# Patient Record
Sex: Male | Born: 1990 | Race: White | Hispanic: No | Marital: Single | Smoking: Former smoker
Health system: Southern US, Community
[De-identification: ages and names within clinical notes are randomized; demographics above are authoritative.]

## PROBLEM LIST (undated history)

## (undated) DIAGNOSIS — J939 Pneumothorax, unspecified: Secondary | ICD-10-CM

## (undated) DIAGNOSIS — F909 Attention-deficit hyperactivity disorder, unspecified type: Secondary | ICD-10-CM

## (undated) HISTORY — PX: HERNIA REPAIR: SHX51

---
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
## 2016-01-23 DIAGNOSIS — J939 Pneumothorax, unspecified: Secondary | ICD-10-CM

## 2016-01-23 HISTORY — PX: CHEST TUBE INSERTION: SHX231

## 2016-01-23 HISTORY — DX: Pneumothorax, unspecified: J93.9

## 2016-01-26 ENCOUNTER — Inpatient Hospital Stay (HOSPITAL_COMMUNITY)
Admission: AD | Admit: 2016-01-26 | Discharge: 2016-01-31 | DRG: 165 | Disposition: A | Discharge status: Home / Self Care | Payer: Self-pay | Source: Other Acute Inpatient Hospital | Attending: Cardiothoracic Surgery | Admitting: Cardiothoracic Surgery

## 2016-01-26 DIAGNOSIS — J9383 Other pneumothorax: Principal | ICD-10-CM | POA: Diagnosis present

## 2016-01-26 DIAGNOSIS — J9382 Other air leak: Secondary | ICD-10-CM | POA: Diagnosis present

## 2016-01-26 DIAGNOSIS — J939 Pneumothorax, unspecified: Secondary | ICD-10-CM | POA: Diagnosis present

## 2016-01-26 DIAGNOSIS — Z9689 Presence of other specified functional implants: Secondary | ICD-10-CM

## 2016-01-26 DIAGNOSIS — F17210 Nicotine dependence, cigarettes, uncomplicated: Secondary | ICD-10-CM | POA: Diagnosis present

## 2016-01-26 DIAGNOSIS — J439 Emphysema, unspecified: Secondary | ICD-10-CM

## 2016-01-26 HISTORY — DX: Pneumothorax, unspecified: J93.9

## 2016-01-26 HISTORY — DX: Attention-deficit hyperactivity disorder, unspecified type: F90.9

## 2016-01-26 MED ORDER — SODIUM CHLORIDE 0.9% FLUSH
3.0000 mL | Freq: Two times a day (BID) | INTRAVENOUS | Status: DC
  Administered 2016-01-26: 3 mL via INTRAVENOUS

## 2016-01-26 MED ORDER — SODIUM CHLORIDE 0.9 % IV SOLN: 250.0000 mL | INTRAVENOUS | Status: DC | PRN

## 2016-01-26 MED ORDER — OXYCODONE HCL 5 MG PO TABS
5.0000 mg | ORAL_TABLET | ORAL | Status: DC | PRN
  Administered 2016-01-26 – 2016-01-29 (×5): 5 mg via ORAL
  Filled 2016-01-26 (×5): qty 1

## 2016-01-26 MED ORDER — SODIUM CHLORIDE 0.9% FLUSH: 3.0000 mL | INTRAVENOUS | Status: DC | PRN

## 2016-01-26 MED ORDER — MORPHINE SULFATE (PF) 2 MG/ML IV SOLN
1.0000 mg | INTRAVENOUS | Status: DC | PRN
  Administered 2016-01-26: 1 mg via INTRAVENOUS
  Filled 2016-01-26: qty 1

## 2016-01-26 MED ORDER — ACETAMINOPHEN 325 MG PO TABS: 650.0000 mg | ORAL_TABLET | Freq: Four times a day (QID) | ORAL | Status: DC | PRN

## 2016-01-26 MED ORDER — ACETAMINOPHEN 650 MG RE SUPP: 650.0000 mg | Freq: Four times a day (QID) | RECTAL | Status: DC | PRN

## 2016-01-26 NOTE — H&P (Signed)
[facility] History and Physical  THOMOS DOMINE VWU:[id number] DOB: Jul 23, 1991 DOA: 01/26/2016  PCP: No primary care provider on file.   Chief Complaint: SOB  HPI: Edward Mcknight is a 25 y.o. male  Previously healthy who developed sob spontaneously 4 days ago. Since was found to have pneumothorax and was transitioned here from different hospital for recommendations by CT surgeon.  He reports no other new complaints. Pt has chest tube in place   Review of Systems:  Constitutional:  No weight loss, night sweats, Fevers, chills, fatigue.  HEENT:  No headaches, Difficulty swallowing,Tooth/dental problems,Sore throat,  No sneezing, itching, ear ache, nasal congestion, post nasal drip,  Cardio-vascular:  No chest pain, Orthopnea, PND, swelling in lower extremities, anasarca, dizziness, palpitations  GI:  No heartburn, indigestion, abdominal pain, nausea, vomiting, diarrhea, change in bowel habits, loss of appetite  Resp:  + shortness of breath with exertion or at rest. No excess mucus, no productive cough, No non-productive cough, No coughing up of blood.No change in color of mucus.No wheezing.No chest wall deformity  Skin:  no rash or lesions.  GU:  no dysuria, change in color of urine, no urgency or frequency. No flank pain.  Musculoskeletal:  No joint pain or swelling. No decreased range of motion. No back pain.  Psych:  No change in mood or affect. No depression or anxiety. No memory loss.   No past medical history on file. No past surgical history on file. Social History:  has no tobacco, alcohol, and drug history on file.  Allergies not on file  Family history - none reported  Prior to Admission medications   Not on File   Physical Exam: Filed Vitals:   01/26/16 1555  BP: 130/88  Pulse: 63  Temp: 98.4 F (36.9 C)  TempSrc: Oral  Resp: 18  Height:  (1.803 m)  Weight: 64.638 kg (142 lb 8 oz)  SpO2: 100%    Wt Readings from Last 3 Encounters:    01/26/16 64.638 kg (142 lb 8 oz)    General:  Appears calm and comfortable Eyes: PERRL, normal lids, irises & conjunctiva ENT: grossly normal hearing, lips & tongue Neck: no LAD, masses or thyromegaly Cardiovascular: RRR, no m/r/g. No LE edema. Respiratory: chest tube in place, no wheezes, speaking in full sentences Abdomen: soft, nt, nd Skin: no rash or induration seen on limited exam Musculoskeletal: grossly normal tone BUE/BLE Psychiatric: grossly normal mood and affect, speech fluent and appropriate Neurologic: grossly non-focal.          Labs on Admission:  Basic Metabolic Panel: No results for input(s): NA, K, CL, CO2, GLUCOSE, BUN, CREATININE, CALCIUM, MG, PHOS in the last 168 hours. Liver Function Tests: No results for input(s): AST, ALT, ALKPHOS, BILITOT, PROT, ALBUMIN in the last 168 hours. No results for input(s): LIPASE, AMYLASE in the last 168 hours. No results for input(s): AMMONIA in the last 168 hours. CBC: No results for input(s): WBC, NEUTROABS, HGB, HCT, MCV, PLT in the last 168 hours. Cardiac Enzymes: No results for input(s): CKTOTAL, CKMB, CKMBINDEX, TROPONINI in the last 168 hours.  BNP (last 3 results) No results for input(s): BNP in the last 8760 hours.  ProBNP (last 3 results) No results for input(s): PROBNP in the last 8760 hours.  CBG: No results for input(s): GLUCAP in the last 168 hours.  Radiological Exams on Admission: No results found.   Assessment/Plan Active Problems:   Pneumothorax - Consulted CT surgeon - continue supportive therapy  Code  Status: Full DVT Prophylaxis: SCD's Family Communication: None at bedside Disposition Plan: Pending surgeon's recommendations.  Time spent: > 35 minutes  Penny Pia [facility] Pager [phone]

## 2016-01-27 ENCOUNTER — Inpatient Hospital Stay (HOSPITAL_COMMUNITY): Payer: Self-pay

## 2016-01-27 DIAGNOSIS — J9311 Primary spontaneous pneumothorax: Secondary | ICD-10-CM

## 2016-01-27 DIAGNOSIS — F17200 Nicotine dependence, unspecified, uncomplicated: Secondary | ICD-10-CM

## 2016-01-27 LAB — BASIC METABOLIC PANEL
Anion gap: 12 (ref 5–15)
BUN: 12 mg/dL (ref 6–20)
CALCIUM: 9.7 mg/dL (ref 8.9–10.3)
CO2: 24 mmol/L (ref 22–32)
CREATININE: 0.87 mg/dL (ref 0.61–1.24)
Chloride: 103 mmol/L (ref 101–111)
GFR calc Af Amer: >60 mL/min (ref >60)
GLUCOSE: 101 mg/dL — AB (ref 65–99)
POTASSIUM: 4 mmol/L (ref 3.5–5.1)
Sodium: 139 mmol/L (ref 135–145)

## 2016-01-27 LAB — CBC
HCT: 44.6 % (ref 39.0–52.0)
HEMOGLOBIN: 15.2 g/dL (ref 13.0–17.0)
MCH: 32.1 pg (ref 26.0–34.0)
MCHC: 34.1 g/dL (ref 30.0–36.0)
MCV: 94.3 fL (ref 78.0–100.0)
PLATELETS: 203 10*3/uL (ref 150–400)
RBC: 4.73 MIL/uL (ref 4.22–5.81)
RDW: 12.5 % (ref 11.5–15.5)
WBC: 9.5 10*3/uL (ref 4.0–10.5)

## 2016-01-27 MED ORDER — CETYLPYRIDINIUM CHLORIDE 0.05 % MT LIQD
7.0000 mL | Freq: Two times a day (BID) | OROMUCOSAL | Status: DC
  Administered 2016-01-28 – 2016-01-30 (×3): 7 mL via OROMUCOSAL

## 2016-01-27 MED ORDER — KETOROLAC TROMETHAMINE 30 MG/ML IJ SOLN: 30.0000 mg | Freq: Four times a day (QID) | INTRAMUSCULAR | Status: DC | PRN

## 2016-01-27 NOTE — Progress Notes (Signed)
[facility] PROGRESS NOTE  Edward Mcknight ZOX:[id number] DOB: 05/15/1991 DOA: 01/26/2016 PCP: No primary care provider on file.  Assessment/Plan:  Active Problems:   Pneumothorax - CT surgeon consulted and on board and assisting. Currently managing chest tube - We will follow along and continue supportive therapy  Nicotine dependence - have offered nicotine patch today which patient refused   Code Status: full Family Communication: discussed with patient and family at bedside.  Disposition Plan: Pending improvement in condition   Consultants:  Cardiothoracic surgeon  Procedures:  none  Antibiotics:  None  HPI/Subjective: Pt has no new complaints. No acute issues overnight  Objective: Filed Vitals:   01/26/16 1929 01/27/16 0549  BP: 122/76 127/75  Pulse: 60 53  Temp: 97.9 F (36.6 C) 97.7 F (36.5 C)  Resp: 19 16    Intake/Output Summary (Last 24 hours) at 01/27/16 0935 Last data filed at 01/27/16 0700  Gross per 24 hour  Intake      0 ml  Output   1252 ml  Net  -1252 ml   Filed Weights   01/26/16 1555  Weight: 64.638 kg (142 lb 8 oz)    Exam:   General:  Pt in nad, alert and awake  Cardiovascular: rrr, no mrg  Respiratory: cta bl, no wheezes  Abdomen: soft, nt, nd  Musculoskeletal: no cyanosis or clubbing   Data Reviewed: Basic Metabolic Panel:  Recent Labs Lab 01/27/16 0214  NA 139  K 4.0  CL 103  CO2 24  GLUCOSE 101*  BUN 12  CREATININE 0.87  CALCIUM 9.7   Liver Function Tests: No results for input(s): AST, ALT, ALKPHOS, BILITOT, PROT, ALBUMIN in the last 168 hours. No results for input(s): LIPASE, AMYLASE in the last 168 hours. No results for input(s): AMMONIA in the last 168 hours. CBC:  Recent Labs Lab 01/27/16 0214  WBC 9.5  HGB 15.2  HCT 44.6  MCV 94.3  PLT 203   Cardiac Enzymes: No results for input(s): CKTOTAL, CKMB, CKMBINDEX, TROPONINI in the last 168 hours. BNP (last 3 results) No results for  input(s): BNP in the last 8760 hours.  ProBNP (last 3 results) No results for input(s): PROBNP in the last 8760 hours.  CBG: No results for input(s): GLUCAP in the last 168 hours.  No results found for this or any previous visit (from the past 240 hour(s)).   Studies: No results found.  Scheduled Meds: . antiseptic oral rinse  7 mL Mouth Rinse BID  . sodium chloride flush  3 mL Intravenous Q12H   Continuous Infusions:    Time spent: > 10 min   Penny Pia  [facility] Pager [phone]. If 7PM-7AM, please contact night-coverage at [email or web address], password [facility] 01/27/2016, 9:35 AM  LOS: 1 day

## 2016-01-27 NOTE — Consult Note (Signed)
[address]       [place] [address]             [phone]        KEIL PICKERING [facility] Medical Record #[id number] Date of Birth: 03/03/1991  Referring: Cena Benton Primary Care: No primary care provider on file.  Chief Complaint:   Pneumothorax  History of Present Illness:      Mr. Dube is a 25 yo white male with no previous history of Pneumothorax.  He states that approximately four days ago he was in his normal state of health.  However, he was moving around some large items at which time he developed sudden onset severe shortness of breath.  He presented to the local ED - [place] at which time he was found to have a left pneumothorax.  Chest tube was placed.  However, per the patient this was not sutured in place and during a coughing episode the chest tube fell out and had to be replaced.  The patient denies medical problems.  He states his father who is now deceased had 2 pneumothorax while he was alive.  He does smoke cigarettes and marijuana on a daily basis.  He is also using smokeless tobacco during his hospitalization to take the "edge" off from not being able to smoke.  Currently he continues to have pain at his chest tube site and some mild shortness of breath.  He also states he can feel air around moving around his chest tube insertion site. Patient was transferred at request of Hospitalitis at [facility].  Current Activity/ Functional Status: Patient is independent with mobility/ambulation, transfers, ADL's, IADL's.   Zubrod Score: At the time of surgery this patient's most appropriate activity status/level should be described as:     0    Normal activity, no symptoms     1    Restricted in physical strenuous activity but ambulatory, able to do out light work     2    Ambulatory and capable of self care, unable to do work activities, up and about                 more than 50%  Of the time                                3    Only limited self  care, in bed greater than 50% of waking hours     4    Completely disabled, no self care, confined to bed or chair     5    Moribund  Past medical history: No previous surgery No dm    History  Smoking status  . Smokes daily tobacco and marijuana  Smokeless tobacco  . Chew s dail;y    History  Alcohol Use: Not on file    Social History   Social History  . Marital Status: Single    Spouse Name: N/A  . Number of Children: N/A  . Years of Education: N/A   Occupational History  . Not on file.   Social History Main Topics  . Smoking status: Daily smoker   . Smokeless tobacco: Daily   . Alcohol Use: Yes   . Drug Use: Yes daily marijuana  . Sexual Activity: Not on file     Allergies  Allergen Reactions  . Cherry Nausea And Vomiting  . Wool Alcohol [Lanolin] Itching  Reaction to wool fabric    Current Facility-Administered Medications  Medication Dose Route Frequency Provider Last Rate Last Dose  . 0.9 %  sodium chloride infusion  250 mL Intravenous PRN Penny Pia, MD      . acetaminophen (TYLENOL) tablet 650 mg  650 mg Oral Q6H PRN Penny Pia, MD       Or  . acetaminophen (TYLENOL) suppository 650 mg  650 mg Rectal Q6H PRN Penny Pia, MD      . antiseptic oral rinse (CPC / CETYLPYRIDINIUM CHLORIDE 0.05%) solution 7 mL  7 mL Mouth Rinse BID Penny Pia, MD      . ketorolac (TORADOL) 30 MG/ML injection 30 mg  30 mg Intravenous Q6H PRN Erin R Barrett, PA-C      . morphine 2 MG/ML injection 1 mg  1 mg Intravenous Q3H PRN Penny Pia, MD   1 mg at 01/26/16 2057  . oxyCODONE (Oxy IR/ROXICODONE) immediate release tablet 5 mg  5 mg Oral Q4H PRN Penny Pia, MD   5 mg at 01/26/16 2230  . sodium chloride flush (NS) 0.9 % injection 3 mL  3 mL Intravenous Q12H Penny Pia, MD   3 mL at 01/26/16 1953  . sodium chloride flush (NS) 0.9 % injection 3 mL  3 mL Intravenous PRN Penny Pia, MD        Prescriptions prior to admission  Medication Sig Dispense Refill  Last Dose  . acetaminophen (TYLENOL) 500 MG tablet Take 2,500-3,000 mg by mouth daily as needed (migraine).   1-2  months ago  . Aspirin-Salicylamide-Caffeine (BC HEADACHE POWDER PO) Take 1 packet by mouth daily as needed (migraine).   2 months ago  . naproxen sodium (ALEVE) 220 MG tablet Take 660 mg by mouth daily as needed (pain/ migraines).   1-2  months ago    Family Hx: Father with copd and recurrent ptx, now deceased   Review of Systems:  Constitutional: negative Ears, nose, mouth, throat, and face: positive for poor dentition Respiratory: positive for cough, dyspnea on exertion and penumothorax Cardiovascular: negative Gastrointestinal: negative Neurological: negative     Cardiac Review of Systems: Y or N  Chest Pain [ n   ]  Resting SOB [  y ] Exertional SOB  Cove.Etienne  ]  Orthopnea [  ]   Pedal Edema [   ]    Palpitations [  ] Syncope  [  ]   Presyncope [   ]  General Review of Systems: [Y] = yes [  ]=no Constitional: recent weight change [  ]; anorexia [  ]; fatigue [  ]; nausea [  ]; night sweats [  ]; fever [  ]; or chills [  ]                                                               Dental: poor dentition[ y ]; Last Dentist visit:   Eye : blurred vision [  ]; diplopia [   ]; vision changes [  ];  Amaurosis fugax[  ]; Resp: cough [ y ];  wheezing[  ];  hemoptysis[  ]; shortness of breath[ y ]; paroxysmal nocturnal dyspnea[  ]; dyspnea on exertion[y  ]; or orthopnea[  ];  GI:  gallstones[  ], vomiting[  ];  dysphagia[  ]; melena[  ];  hematochezia [  ]; heartburn[  ];   Hx of  Colonoscopy[  ]; GU: kidney stones [  ]; hematuria[  ];   dysuria [  ];  nocturia[  ];  history of     obstruction [  ]; urinary frequency [  ]             Skin: rash, swelling[  ];, hair loss[  ];  peripheral edema[  ];  or itching[  ]; Musculosketetal: myalgias[  ];  joint swelling[  ];  joint erythema[  ];  joint pain[  ];  back pain[  ];  Heme/Lymph: bruising[  ];  bleeding[  ];  anemia[  ];  Neuro:  TIA[  ];  headaches[  ];  stroke[  ];  vertigo[  ];  seizures[  ];   paresthesias[  ];  difficulty walking[  ];  Psych:depression[  ]; anxiety[  ];  Endocrine: diabetes[  ];  thyroid dysfunction[  ];  Immunizations: Flu [  ]; Pneumococcal[  ];  Other:  Physical Exam: BP 127/75 mmHg  Pulse 53  Temp(Src) 97.7 F (36.5 C) (Oral)  Resp 16  Ht  (1.803 m)  Wt 142 lb 8 oz (64.638 kg)  BMI 19.88 kg/m2  SpO2 100%   General appearance: alert, cooperative and no distress Head: Normocephalic, without obvious abnormality, atraumatic Neck: no adenopathy, no carotid bruit, no JVD, supple, symmetrical, trachea midline and thyroid not enlarged, symmetric, no tenderness/mass/nodules Resp: clear to auscultation bilaterally Cardio: regular rate and rhythm GI: soft, non-tender; bowel sounds normal; no masses,  no organomegaly Extremities: extremities normal, atraumatic, no cyanosis or edema Neurologic: Grossly normal  Pleur vac changed, drainage system checked. On 20 suction, air leak with cough ,    Diagnostic Studies & Laboratory data:     Recent Radiology Findings:  No results found.   film done this am not read yet, no obvious ptx I have independently reviewed the above radiology studies  and reviewed the findings with the patient.  I have independently reviewed the above radiologic studies.  Recent Lab Findings: Lab Results  Component Value Date   WBC 9.5 01/27/2016   HGB 15.2 01/27/2016   HCT 44.6 01/27/2016   PLT 203 01/27/2016   GLUCOSE 101* 01/27/2016   NA 139 01/27/2016   K 4.0 01/27/2016   CL 103 01/27/2016   CREATININE 0.87 01/27/2016   BUN 12 01/27/2016   CO2 24 01/27/2016      Assessment / Plan:      1. Spontaneous pneumothorax- developed 4 days ago after heavy lifting, chest tube placed, fell out and replaced- currently chest tube has constant small intermittent air leak and 1-2+ with cough.... Tight occlusive dressing placed- leave chest tube to 20 cm  suction today 2. Pulm- CXR without pneumothorax, small amount of sub-q air present, continue IS 3. Pain control- continue Oxy, will add Toradol q 6 prn pain 4. Dispo- leave chest tube on suction today, repeat CXR in AM Will plan ct of chest no contrast tomorrow if air leak persists  I have discussed with patient poss left VATS Tuesday if we do not see improvement in chest tube /airleak over next 24 hours. Discussed with patient need to immediately stop smoking  Risks and options for treatment of PTX reviewed with the patient  I  spent 40 minutes counseling the patient face to face and 50% or more the  time was spent in counseling and  coordination of care. The total time spent in the appointment was 55 minutes.  Delight Ovens MD      [address] [place] [place] [address] Office [phone]   Beeper [phone]

## 2016-01-27 NOTE — Progress Notes (Signed)
Utilization review completed.  

## 2016-01-28 ENCOUNTER — Encounter (HOSPITAL_COMMUNITY): Payer: Self-pay | Admitting: General Practice

## 2016-01-28 ENCOUNTER — Inpatient Hospital Stay (HOSPITAL_COMMUNITY): Payer: Self-pay

## 2016-01-28 LAB — COMPREHENSIVE METABOLIC PANEL
ALT: 15 U/L — ABNORMAL LOW (ref 17–63)
AST: 15 U/L (ref 15–41)
Albumin: 4.2 g/dL (ref 3.5–5.0)
Alkaline Phosphatase: 61 U/L (ref 38–126)
Anion gap: 10 (ref 5–15)
BUN: 17 mg/dL (ref 6–20)
CO2: 26 mmol/L (ref 22–32)
Calcium: 9.6 mg/dL (ref 8.9–10.3)
Chloride: 98 mmol/L — ABNORMAL LOW (ref 101–111)
Creatinine, Ser: 1.07 mg/dL (ref 0.61–1.24)
GFR calc Af Amer: >60 mL/min (ref >60)
GFR calc non Af Amer: >60 mL/min (ref >60)
Glucose, Bld: 98 mg/dL (ref 65–99)
Potassium: 4.2 mmol/L (ref 3.5–5.1)
Sodium: 134 mmol/L — ABNORMAL LOW (ref 135–145)
Total Bilirubin: 0.6 mg/dL (ref 0.3–1.2)
Total Protein: 7.2 g/dL (ref 6.5–8.1)

## 2016-01-28 LAB — TYPE AND SCREEN
ABO/RH(D): A POS
Antibody Screen: NEGATIVE

## 2016-01-28 LAB — BASIC METABOLIC PANEL
ANION GAP: 8 (ref 5–15)
BUN: 15 mg/dL (ref 6–20)
CHLORIDE: 103 mmol/L (ref 101–111)
CO2: 29 mmol/L (ref 22–32)
CREATININE: 1.04 mg/dL (ref 0.61–1.24)
Calcium: 9.8 mg/dL (ref 8.9–10.3)
GFR calc non Af Amer: >60 mL/min (ref >60)
GLUCOSE: 110 mg/dL — AB (ref 65–99)
Potassium: 4.2 mmol/L (ref 3.5–5.1)
Sodium: 140 mmol/L (ref 135–145)

## 2016-01-28 LAB — URINALYSIS, ROUTINE W REFLEX MICROSCOPIC
Bilirubin Urine: NEGATIVE
Glucose, UA: NEGATIVE mg/dL
Hgb urine dipstick: NEGATIVE
Ketones, ur: NEGATIVE mg/dL
Leukocytes, UA: NEGATIVE
Nitrite: NEGATIVE
Protein, ur: NEGATIVE mg/dL
Specific Gravity, Urine: 1.017 (ref 1.005–1.030)
pH: 7.5 (ref 5.0–8.0)

## 2016-01-28 LAB — CBC
HCT: 45.2 % (ref 39.0–52.0)
Hemoglobin: 15.4 g/dL (ref 13.0–17.0)
MCH: 31.7 pg (ref 26.0–34.0)
MCHC: 34.1 g/dL (ref 30.0–36.0)
MCV: 93 fL (ref 78.0–100.0)
Platelets: 208 10*3/uL (ref 150–400)
RBC: 4.86 MIL/uL (ref 4.22–5.81)
RDW: 12.1 % (ref 11.5–15.5)
WBC: 10.2 10*3/uL (ref 4.0–10.5)

## 2016-01-28 LAB — BLOOD GAS, ARTERIAL
Acid-base deficit: 1.2 mmol/L (ref 0.0–2.0)
Bicarbonate: 23.1 mEq/L (ref 20.0–24.0)
Drawn by: 331761
FIO2: 0.21
O2 Saturation: 96.7 %
TCO2: 24.3 mmol/L (ref 0–100)
pCO2 arterial: 39.2 mmHg (ref 35.0–45.0)
pH, Arterial: 7.388 (ref 7.350–7.450)
pO2, Arterial: 88.6 mmHg (ref 80.0–100.0)

## 2016-01-28 LAB — APTT: aPTT: 29 seconds (ref 24–37)

## 2016-01-28 LAB — PROTIME-INR
INR: 0.99 (ref 0.00–1.49)
Prothrombin Time: 13.3 seconds (ref 11.6–15.2)

## 2016-01-28 LAB — ABO/RH: ABO/RH(D): A POS

## 2016-01-28 LAB — MRSA PCR SCREENING: MRSA by PCR: NEGATIVE

## 2016-01-28 MED ORDER — CEFUROXIME SODIUM 1.5 G IJ SOLR
1.5000 g | INTRAMUSCULAR | Status: DC
  Filled 2016-01-28: qty 1.5

## 2016-01-28 NOTE — Progress Notes (Addendum)
[address]       [place] [address]             [phone]      Subjective:  No new complaints.  Continues to have some pain at chest tube site.  Objective: Vital signs in last 24 hours: Temp:  [97.8 F (36.6 C)-98.3 F (36.8 C)] 98.3 F (36.8 C) (02/06 0551) Pulse Rate:  [63-72] 65 (02/06 0551) Cardiac Rhythm:  [-] Normal sinus rhythm (02/05 2256) Resp:  [18] 18 (02/06 0551) BP: (113-119)/(65-68) 113/65 mmHg (02/06 0551) SpO2:  [99 %-100 %] 99 % (02/06 0551)  Intake/Output from previous day: 02/05 0701 - 02/06 0700 In: 840 [P.O.:840] Out: 830 [Urine:800; Chest Tube:30]  General appearance: alert, cooperative and no distress Heart: regular rate and rhythm Lungs: clear to auscultation bilaterally Abdomen: soft, non-tender; bowel sounds normal; no masses,  no organomegaly Wound: clean and dry  Lab Results:  Recent Labs  01/27/16 0214  WBC 9.5  HGB 15.2  HCT 44.6  PLT 203   BMET:  Recent Labs  01/27/16 0214 01/28/16 0318  NA 139 140  K 4.0 4.2  CL 103 103  CO2 24 29  GLUCOSE 101* 110*  BUN 12 15  CREATININE 0.87 1.04  CALCIUM 9.7 9.8    PT/INR: No results for input(s): LABPROT, INR in the last 72 hours. ABG No results found for: PHART, HCO3, TCO2, ACIDBASEDEF, O2SAT CBG (last 3)  No results for input(s): GLUCAP in the last 72 hours.  Assessment/Plan:  1. Spontaneous Pneumothorax- chest tube in place on 20 cm of suction- today air leak is much improved about 1+- there is no drainage present 2. Pulm- CXR is stable, no evidence of pneumothorax, there continues to be evidence of sub q air around chest tube site 3. Dispo- patient stable, air leak improved, may be able to hold off on CT of chest since air leak is much improved, however will defer to Dr. Tyrone Sage, continue current care per primary   LOS: 2 days    Edward Mcknight, Edward Mcknight 01/28/2016  Still with 3 + air leak with cough, tube been in since 2/1 before transfer here Ct of  chest this am Likely vats tomorrow I have seen and examined Edward Mcknight and agree with the above assessment  and plan.  Delight Ovens MD Beeper [phone] Office [phone] 01/28/2016 8:04 AM  Ct Chest Wo Contrast  01/28/2016  CLINICAL DATA:  Follow-up pneumothorax status post chest tube insertion EXAM: CT CHEST WITHOUT CONTRAST TECHNIQUE: Multidetector CT imaging of the chest was performed following the standard protocol without IV contrast. COMPARISON:  Chest radiograph dated 02/07/2016 at 0640 hours FINDINGS: Mediastinum/Nodes: The heart is normal in size. No pericardial effusion. No suspicious mediastinal lymphadenopathy. Visualized thyroid is unremarkable. Lungs/Pleura: Left chest tube abuts the left heart border, tracking along the fissure. Residual small left pneumothorax. Mild patchy opacity in the left lower lobe, likely dependent atelectasis. Mild centrilobular/ paraseptal emphysematous changes in the bilateral lung apices. Right lung is otherwise clear. 4 mm left lower lobe pulmonary nodule (series 3/ image 39), likely benign given the patient's young age. No pleural effusion. Upper abdomen: Visualized upper abdomen is within normal limits. Musculoskeletal: Subcutaneous emphysema along the left lateral chest wall. No rib fracture is seen. Visualized osseous structures are within normal limits. IMPRESSION: Indwelling left chest tube, as above. Residual small left pneumothorax. Associated subcutaneous emphysema along the left lateral chest wall. Mild emphysematous changes in the  bilateral lung apices. Electronically Signed   By: Charline Bills M.D.   On: 01/28/2016 10:44   Dg Chest Port 1 View  01/28/2016  CLINICAL DATA:  Left-sided pneumothorax EXAM: PORTABLE CHEST 1 VIEW COMPARISON:  Portable chest x-ray of January 27, 2016 FINDINGS: The left-sided pneumothorax has slightly increased in volume. The pleural line lies between the third and fourth posterior rib interspaces. This amounts to  between 5 and 10% of the lung volume. The left-sided chest tube is unchanged with its tip overlying the medial aspect of the ninth rib posteriorly. The right lung is clear. There is no mediastinal shift. There is no pleural effusion. The heart and pulmonary vascularity are normal. The observed bony thorax is unremarkable. IMPRESSION: Slight interval increase in the pneumothorax volume amounting to approximately 5-10% of the pleural space volume. The left-sided chest tube is in stable position. Electronically Signed   By: David  [place] M.D.   On: 01/28/2016 07:58   Dg Chest Port 1 View  01/27/2016  CLINICAL DATA:  Chest tube EXAM: PORTABLE CHEST 1 VIEW COMPARISON:  01/26/2016 FINDINGS: Left chest tube remains in good position. Possible small left apical pneumothorax, not definite. Mild left lower lobe atelectasis. No effusion. Right lung is clear. IMPRESSION: Left chest tube remains in place. Possible small left apical pneumothorax. Left lower lobe atelectasis Electronically Signed   By: Marlan Palau M.D.   On: 01/27/2016 10:14   With persistent air leak have recommended to patient proceeding with left VATS and stapling of blebs. The goals risks and alternatives of the planned surgical procedure Bronchoscopy left VATS and stapling of blebs   have been discussed with the patient in detail. The risks of the procedure including death, infection, stroke, myocardial infarction, bleeding, blood transfusion have all been discussed specifically.  I have quoted Edward Mcknight a 1 % of perioperative mortality and a complication rate as high as 10 %. The patient's questions have been answered.Edward Mcknight is willing  to proceed with the planned procedure.  Delight Ovens MD      [address] [place] [place] [address] Office [phone]   Beeper [phone]

## 2016-01-28 NOTE — Progress Notes (Signed)
[facility] PROGRESS NOTE  Edward Mcknight:[id number] DOB: 22-Apr-1991 DOA: 01/26/2016 PCP: No primary care provider on file.  Assessment/Plan:  Active Problems:   Pneumothorax - CT surgeon consulted and on board and assisting. Currently managing chest tube - Continue supportive therapy  Nicotine dependence - have offered nicotine patch today which patient refused   Code Status: full Family Communication: discussed with patient and family at bedside.  Disposition Plan: Pending improvement in condition   Consultants:  Cardiothoracic surgeon  Procedures:  none  Antibiotics:  None  HPI/Subjective: Pt feels better he has concerns about surgical treatment  Objective: Filed Vitals:   01/27/16 2125 01/28/16 0551  BP: 115/68 113/65  Pulse: 63 65  Temp: 98.1 F (36.7 C) 98.3 F (36.8 C)  Resp: 18 18    Intake/Output Summary (Last 24 hours) at 01/28/16 1218 Last data filed at 01/28/16 0730  Gross per 24 hour  Intake    720 ml  Output    980 ml  Net   -260 ml   Filed Weights   01/26/16 1555  Weight: 64.638 kg (142 lb 8 oz)    Exam:   General:  Pt in nad, alert and awake  Cardiovascular: rrr, no mrg  Respiratory: cta bl, no wheezes  Abdomen: soft, nt, nd  Musculoskeletal: no cyanosis or clubbing   Data Reviewed: Basic Metabolic Panel:  Recent Labs Lab 01/27/16 0214 01/28/16 0318  NA 139 140  K 4.0 4.2  CL 103 103  CO2 24 29  GLUCOSE 101* 110*  BUN 12 15  CREATININE 0.87 1.04  CALCIUM 9.7 9.8   Liver Function Tests: No results for input(s): AST, ALT, ALKPHOS, BILITOT, PROT, ALBUMIN in the last 168 hours. No results for input(s): LIPASE, AMYLASE in the last 168 hours. No results for input(s): AMMONIA in the last 168 hours. CBC:  Recent Labs Lab 01/27/16 0214  WBC 9.5  HGB 15.2  HCT 44.6  MCV 94.3  PLT 203   Cardiac Enzymes: No results for input(s): CKTOTAL, CKMB, CKMBINDEX, TROPONINI in the last 168 hours. BNP (last 3  results) No results for input(s): BNP in the last 8760 hours.  ProBNP (last 3 results) No results for input(s): PROBNP in the last 8760 hours.  CBG: No results for input(s): GLUCAP in the last 168 hours.  No results found for this or any previous visit (from the past 240 hour(s)).   Studies: Ct Chest Wo Contrast  01/28/2016  CLINICAL DATA:  Follow-up pneumothorax status post chest tube insertion EXAM: CT CHEST WITHOUT CONTRAST TECHNIQUE: Multidetector CT imaging of the chest was performed following the standard protocol without IV contrast. COMPARISON:  Chest radiograph dated 02/07/2016 at 0640 hours FINDINGS: Mediastinum/Nodes: The heart is normal in size. No pericardial effusion. No suspicious mediastinal lymphadenopathy. Visualized thyroid is unremarkable. Lungs/Pleura: Left chest tube abuts the left heart border, tracking along the fissure. Residual small left pneumothorax. Mild patchy opacity in the left lower lobe, likely dependent atelectasis. Mild centrilobular/ paraseptal emphysematous changes in the bilateral lung apices. Right lung is otherwise clear. 4 mm left lower lobe pulmonary nodule (series 3/ image 39), likely benign given the patient's young age. No pleural effusion. Upper abdomen: Visualized upper abdomen is within normal limits. Musculoskeletal: Subcutaneous emphysema along the left lateral chest wall. No rib fracture is seen. Visualized osseous structures are within normal limits. IMPRESSION: Indwelling left chest tube, as above. Residual small left pneumothorax. Associated subcutaneous emphysema along the left lateral chest wall. Mild emphysematous changes in  the bilateral lung apices. Electronically Signed   By: Charline Bills M.D.   On: 01/28/2016 10:44   Dg Chest Port 1 View  01/28/2016  CLINICAL DATA:  Left-sided pneumothorax EXAM: PORTABLE CHEST 1 VIEW COMPARISON:  Portable chest x-ray of January 27, 2016 FINDINGS: The left-sided pneumothorax has slightly increased in  volume. The pleural line lies between the third and fourth posterior rib interspaces. This amounts to between 5 and 10% of the lung volume. The left-sided chest tube is unchanged with its tip overlying the medial aspect of the ninth rib posteriorly. The right lung is clear. There is no mediastinal shift. There is no pleural effusion. The heart and pulmonary vascularity are normal. The observed bony thorax is unremarkable. IMPRESSION: Slight interval increase in the pneumothorax volume amounting to approximately 5-10% of the pleural space volume. The left-sided chest tube is in stable position. Electronically Signed   By: David  [place] M.D.   On: 01/28/2016 07:58   Dg Chest Port 1 View  01/27/2016  CLINICAL DATA:  Chest tube EXAM: PORTABLE CHEST 1 VIEW COMPARISON:  01/26/2016 FINDINGS: Left chest tube remains in good position. Possible small left apical pneumothorax, not definite. Mild left lower lobe atelectasis. No effusion. Right lung is clear. IMPRESSION: Left chest tube remains in place. Possible small left apical pneumothorax. Left lower lobe atelectasis Electronically Signed   By: Marlan Palau M.D.   On: 01/27/2016 10:14    Scheduled Meds: . antiseptic oral rinse  7 mL Mouth Rinse BID  . sodium chloride flush  3 mL Intravenous Q12H   Continuous Infusions:    Time spent: > 10 min   Penny Pia  [facility] Pager [phone]. If 7PM-7AM, please contact night-coverage at [email or web address], password [facility] 01/28/2016, 12:18 PM  LOS: 2 days

## 2016-01-29 ENCOUNTER — Encounter (HOSPITAL_COMMUNITY)
Admission: AD | Disposition: A | Discharge status: Home / Self Care | Payer: Self-pay | Source: Other Acute Inpatient Hospital | Attending: Family Medicine

## 2016-01-29 ENCOUNTER — Inpatient Hospital Stay (HOSPITAL_COMMUNITY): Payer: Self-pay | Admitting: Anesthesiology

## 2016-01-29 ENCOUNTER — Inpatient Hospital Stay (HOSPITAL_COMMUNITY): Payer: Self-pay

## 2016-01-29 ENCOUNTER — Inpatient Hospital Stay (HOSPITAL_COMMUNITY): Payer: MEDICAID | Admitting: Anesthesiology

## 2016-01-29 ENCOUNTER — Inpatient Hospital Stay (HOSPITAL_COMMUNITY): Payer: MEDICAID

## 2016-01-29 ENCOUNTER — Encounter (HOSPITAL_COMMUNITY): Payer: Self-pay | Admitting: Certified Registered Nurse Anesthetist

## 2016-01-29 DIAGNOSIS — J939 Pneumothorax, unspecified: Secondary | ICD-10-CM | POA: Diagnosis present

## 2016-01-29 HISTORY — PX: VIDEO ASSISTED THORACOSCOPY: SHX5073

## 2016-01-29 HISTORY — PX: VIDEO BRONCHOSCOPY: SHX5072

## 2016-01-29 LAB — POCT I-STAT 7, (LYTES, BLD GAS, ICA,H+H)
Acid-base deficit: 4 mmol/L — ABNORMAL HIGH (ref 0.0–2.0)
Bicarbonate: 28.9 mEq/L — ABNORMAL HIGH (ref 20.0–24.0)
Calcium, Ion: 1.34 mmol/L — ABNORMAL HIGH (ref 1.12–1.23)
HEMATOCRIT: 46 % (ref 39.0–52.0)
Hemoglobin: 15.6 g/dL (ref 13.0–17.0)
O2 SAT: 100 %
PCO2 ART: 91.5 mmHg — AB (ref 35.0–45.0)
PO2 ART: 254 mmHg — AB (ref 80.0–100.0)
POTASSIUM: 4.5 mmol/L (ref 3.5–5.1)
Sodium: 140 mmol/L (ref 135–145)
TCO2: 32 mmol/L (ref 0–100)
pH, Arterial: 7.107 — CL (ref 7.350–7.450)

## 2016-01-29 LAB — GLUCOSE, CAPILLARY: GLUCOSE-CAPILLARY: 124 mg/dL — AB (ref 65–99)

## 2016-01-29 SURGERY — BRONCHOSCOPY, VIDEO-ASSISTED
Anesthesia: General | Site: Chest

## 2016-01-29 MED ORDER — ROCURONIUM BROMIDE 50 MG/5ML IV SOLN
INTRAVENOUS | Status: AC
  Filled 2016-01-29: qty 1

## 2016-01-29 MED ORDER — HYDROMORPHONE 1 MG/ML IV SOLN
INTRAVENOUS | Status: AC
  Filled 2016-01-29: qty 25

## 2016-01-29 MED ORDER — ACETAMINOPHEN 500 MG PO TABS
1000.0000 mg | ORAL_TABLET | Freq: Four times a day (QID) | ORAL | Status: DC
  Administered 2016-01-29 – 2016-01-31 (×6): 1000 mg via ORAL
  Filled 2016-01-29 (×7): qty 2

## 2016-01-29 MED ORDER — ROCURONIUM BROMIDE 100 MG/10ML IV SOLN
INTRAVENOUS | Status: DC | PRN
  Administered 2016-01-29: 50 mg via INTRAVENOUS
  Administered 2016-01-29: 10 mg via INTRAVENOUS

## 2016-01-29 MED ORDER — SUCCINYLCHOLINE CHLORIDE 20 MG/ML IJ SOLN
INTRAMUSCULAR | Status: AC
  Filled 2016-01-29: qty 1

## 2016-01-29 MED ORDER — FENTANYL 40 MCG/ML IV SOLN
INTRAVENOUS | Status: AC
  Filled 2016-01-29: qty 25

## 2016-01-29 MED ORDER — PROPOFOL 10 MG/ML IV BOLUS
INTRAVENOUS | Status: DC | PRN
  Administered 2016-01-29: 200 mg via INTRAVENOUS

## 2016-01-29 MED ORDER — ONDANSETRON HCL 4 MG/2ML IJ SOLN
4.0000 mg | Freq: Four times a day (QID) | INTRAMUSCULAR | Status: DC | PRN
  Filled 2016-01-29: qty 2

## 2016-01-29 MED ORDER — SUGAMMADEX SODIUM 200 MG/2ML IV SOLN
INTRAVENOUS | Status: AC
  Filled 2016-01-29: qty 2

## 2016-01-29 MED ORDER — MIDAZOLAM HCL 2 MG/2ML IJ SOLN
INTRAMUSCULAR | Status: AC
  Administered 2016-01-29: 1 mg via INTRAVENOUS
  Filled 2016-01-29: qty 2

## 2016-01-29 MED ORDER — NALOXONE HCL 0.4 MG/ML IJ SOLN
0.4000 mg | INTRAMUSCULAR | Status: DC | PRN
  Filled 2016-01-29: qty 1

## 2016-01-29 MED ORDER — FENTANYL 40 MCG/ML IV SOLN
INTRAVENOUS | Status: DC
  Administered 2016-01-29: 15:00:00 via INTRAVENOUS
  Administered 2016-01-29: 170 ug via INTRAVENOUS

## 2016-01-29 MED ORDER — FENTANYL CITRATE (PF) 100 MCG/2ML IJ SOLN
INTRAMUSCULAR | Status: DC | PRN
Start: 2016-01-29 — End: 2016-01-29
  Administered 2016-01-29: 50 ug via INTRAVENOUS
  Administered 2016-01-29: 100 ug via INTRAVENOUS
  Administered 2016-01-29 (×3): 50 ug via INTRAVENOUS

## 2016-01-29 MED ORDER — DEXTROSE 5 % IV SOLN
1.5000 g | Freq: Two times a day (BID) | INTRAVENOUS | Status: AC
  Administered 2016-01-29 – 2016-01-30 (×2): 1.5 g via INTRAVENOUS
  Filled 2016-01-29 (×2): qty 1.5

## 2016-01-29 MED ORDER — LACTATED RINGERS IV SOLN
INTRAVENOUS | Status: DC
  Administered 2016-01-29 (×3): via INTRAVENOUS

## 2016-01-29 MED ORDER — LIDOCAINE HCL (CARDIAC) 20 MG/ML IV SOLN
INTRAVENOUS | Status: AC
  Filled 2016-01-29: qty 5

## 2016-01-29 MED ORDER — PHENYLEPHRINE 40 MCG/ML (10ML) SYRINGE FOR IV PUSH (FOR BLOOD PRESSURE SUPPORT)
PREFILLED_SYRINGE | INTRAVENOUS | Status: AC
  Filled 2016-01-29: qty 10

## 2016-01-29 MED ORDER — DEXAMETHASONE SODIUM PHOSPHATE 10 MG/ML IJ SOLN
INTRAMUSCULAR | Status: DC | PRN
  Administered 2016-01-29: 10 mg via INTRAVENOUS

## 2016-01-29 MED ORDER — BISACODYL 5 MG PO TBEC
10.0000 mg | DELAYED_RELEASE_TABLET | Freq: Every day | ORAL | Status: DC
  Administered 2016-01-31: 10 mg via ORAL
  Filled 2016-01-29 (×3): qty 2

## 2016-01-29 MED ORDER — EPHEDRINE SULFATE 50 MG/ML IJ SOLN
INTRAMUSCULAR | Status: AC
  Filled 2016-01-29: qty 1

## 2016-01-29 MED ORDER — MIDAZOLAM HCL 2 MG/2ML IJ SOLN
INTRAMUSCULAR | Status: AC
  Filled 2016-01-29: qty 2

## 2016-01-29 MED ORDER — MIDAZOLAM HCL 5 MG/5ML IJ SOLN
INTRAMUSCULAR | Status: DC | PRN
  Administered 2016-01-29: 2 mg via INTRAVENOUS

## 2016-01-29 MED ORDER — HYDROMORPHONE HCL 1 MG/ML IJ SOLN
0.2500 mg | INTRAMUSCULAR | Status: DC | PRN
  Administered 2016-01-29: 0.5 mg via INTRAVENOUS
  Administered 2016-01-29: 1 mg via INTRAVENOUS

## 2016-01-29 MED ORDER — DIPHENHYDRAMINE HCL 50 MG/ML IJ SOLN
12.5000 mg | Freq: Four times a day (QID) | INTRAMUSCULAR | Status: DC | PRN
  Filled 2016-01-29: qty 0.25

## 2016-01-29 MED ORDER — SUGAMMADEX SODIUM 200 MG/2ML IV SOLN
INTRAVENOUS | Status: DC | PRN
  Administered 2016-01-29: 200 mg via INTRAVENOUS

## 2016-01-29 MED ORDER — PROPOFOL 10 MG/ML IV BOLUS
INTRAVENOUS | Status: AC
  Filled 2016-01-29: qty 20

## 2016-01-29 MED ORDER — HYDROMORPHONE HCL 1 MG/ML IJ SOLN
INTRAMUSCULAR | Status: AC
  Filled 2016-01-29: qty 1

## 2016-01-29 MED ORDER — DIPHENHYDRAMINE HCL 12.5 MG/5ML PO ELIX
12.5000 mg | ORAL_SOLUTION | Freq: Four times a day (QID) | ORAL | Status: DC | PRN
  Filled 2016-01-29: qty 5

## 2016-01-29 MED ORDER — PANTOPRAZOLE SODIUM 40 MG PO TBEC
40.0000 mg | DELAYED_RELEASE_TABLET | Freq: Every day | ORAL | Status: DC
  Administered 2016-01-29 – 2016-01-31 (×3): 40 mg via ORAL
  Filled 2016-01-29 (×3): qty 1

## 2016-01-29 MED ORDER — FENTANYL CITRATE (PF) 250 MCG/5ML IJ SOLN
INTRAMUSCULAR | Status: AC
  Filled 2016-01-29: qty 5

## 2016-01-29 MED ORDER — OXYCODONE HCL 5 MG PO TABS
5.0000 mg | ORAL_TABLET | ORAL | Status: DC | PRN
  Administered 2016-01-29: 5 mg via ORAL
  Administered 2016-01-30 – 2016-01-31 (×5): 10 mg via ORAL
  Filled 2016-01-29 (×2): qty 2
  Filled 2016-01-29: qty 1
  Filled 2016-01-29 (×3): qty 2

## 2016-01-29 MED ORDER — 0.9 % SODIUM CHLORIDE (POUR BTL) OPTIME
TOPICAL | Status: DC | PRN
  Administered 2016-01-29: 1000 mL

## 2016-01-29 MED ORDER — KCL IN DEXTROSE-NACL 20-5-0.9 MEQ/L-%-% IV SOLN
INTRAVENOUS | Status: DC
  Administered 2016-01-29: 23:00:00 via INTRAVENOUS
  Filled 2016-01-29: qty 1000

## 2016-01-29 MED ORDER — PANTOPRAZOLE SODIUM 40 MG PO TBEC: 40.0000 mg | DELAYED_RELEASE_TABLET | Freq: Every day | ORAL | Status: DC

## 2016-01-29 MED ORDER — TRAMADOL HCL 50 MG PO TABS
50.0000 mg | ORAL_TABLET | Freq: Four times a day (QID) | ORAL | Status: DC | PRN
  Administered 2016-01-31: 100 mg via ORAL
  Filled 2016-01-29: qty 2

## 2016-01-29 MED ORDER — SENNOSIDES-DOCUSATE SODIUM 8.6-50 MG PO TABS
1.0000 | ORAL_TABLET | Freq: Every day | ORAL | Status: DC
  Administered 2016-01-30: 1 via ORAL
  Filled 2016-01-29 (×2): qty 1

## 2016-01-29 MED ORDER — ONDANSETRON HCL 4 MG/2ML IJ SOLN
INTRAMUSCULAR | Status: AC
  Filled 2016-01-29: qty 2

## 2016-01-29 MED ORDER — SODIUM CHLORIDE 0.9% FLUSH: 9.0000 mL | INTRAVENOUS | Status: DC | PRN

## 2016-01-29 MED ORDER — DEXTROSE 5 % IV SOLN
1.5000 g | INTRAVENOUS | Status: DC | PRN
  Administered 2016-01-29: 1.5 g via INTRAVENOUS

## 2016-01-29 MED ORDER — LIDOCAINE HCL (CARDIAC) 20 MG/ML IV SOLN
INTRAVENOUS | Status: DC | PRN
  Administered 2016-01-29: 50 mg via INTRAVENOUS

## 2016-01-29 MED ORDER — ONDANSETRON HCL 4 MG/2ML IJ SOLN
INTRAMUSCULAR | Status: DC | PRN
  Administered 2016-01-29: 4 mg via INTRAVENOUS

## 2016-01-29 MED ORDER — FENTANYL CITRATE (PF) 100 MCG/2ML IJ SOLN
INTRAMUSCULAR | Status: AC
  Administered 2016-01-29: 50 ug via INTRAVENOUS
  Filled 2016-01-29: qty 2

## 2016-01-29 MED ORDER — POTASSIUM CHLORIDE 10 MEQ/50ML IV SOLN
10.0000 meq | Freq: Every day | INTRAVENOUS | Status: DC | PRN
  Filled 2016-01-29: qty 50

## 2016-01-29 MED ORDER — INSULIN ASPART 100 UNIT/ML ~~LOC~~ SOLN: 0.0000 [IU] | Freq: Four times a day (QID) | SUBCUTANEOUS | Status: DC

## 2016-01-29 MED ORDER — PHENYLEPHRINE HCL 10 MG/ML IJ SOLN
10.0000 mg | INTRAVENOUS | Status: DC | PRN
  Administered 2016-01-29: 50 ug/min via INTRAVENOUS

## 2016-01-29 MED ORDER — ACETAMINOPHEN 160 MG/5ML PO SOLN: 1000.0000 mg | Freq: Four times a day (QID) | ORAL | Status: DC

## 2016-01-29 MED ORDER — ONDANSETRON HCL 4 MG/2ML IJ SOLN
4.0000 mg | Freq: Four times a day (QID) | INTRAMUSCULAR | Status: DC | PRN
  Administered 2016-01-29 – 2016-01-30 (×2): 4 mg via INTRAVENOUS
  Filled 2016-01-29 (×4): qty 2

## 2016-01-29 SURGICAL SUPPLY — 77 items
APPLICATOR TIP COSEAL (VASCULAR PRODUCTS) IMPLANT
APPLICATOR TIP EXT COSEAL (VASCULAR PRODUCTS) IMPLANT
BLADE SURG 11 STRL SS (BLADE) IMPLANT
BRUSH CYTOL CELLEBRITY 1.5X140 (MISCELLANEOUS) IMPLANT
CANISTER SUCTION 2500CC (MISCELLANEOUS) ×3 IMPLANT
CATH KIT ON Q 5IN SLV (PAIN MANAGEMENT) IMPLANT
CATH THORACIC 28FR (CATHETERS) ×3 IMPLANT
CATH THORACIC 36FR (CATHETERS) IMPLANT
CATH THORACIC 36FR RT ANG (CATHETERS) IMPLANT
CLEANER TIP ELECTROSURG 2X2 (MISCELLANEOUS) ×6 IMPLANT
CLIP TI MEDIUM 6 (CLIP) IMPLANT
CONT SPEC 4OZ CLIKSEAL STRL BL (MISCELLANEOUS) ×9 IMPLANT
COVER TABLE BACK 60X90 (DRAPES) ×3 IMPLANT
DERMABOND ADHESIVE PROPEN (GAUZE/BANDAGES/DRESSINGS) ×1
DERMABOND ADVANCED (GAUZE/BANDAGES/DRESSINGS) ×1
DERMABOND ADVANCED .7 DNX12 (GAUZE/BANDAGES/DRESSINGS) ×2 IMPLANT
DERMABOND ADVANCED .7 DNX6 (GAUZE/BANDAGES/DRESSINGS) ×2 IMPLANT
DRAPE LAPAROSCOPIC ABDOMINAL (DRAPES) ×3 IMPLANT
DRAPE WARM FLUID 44X44 (DRAPE) ×3 IMPLANT
DRILL BIT 7/64X5 (BIT) IMPLANT
ELECT BLADE 4.0 EZ CLEAN MEGAD (MISCELLANEOUS) ×3
ELECT REM PT RETURN 9FT ADLT (ELECTROSURGICAL) ×3
ELECTRODE BLDE 4.0 EZ CLN MEGD (MISCELLANEOUS) ×2 IMPLANT
ELECTRODE REM PT RTRN 9FT ADLT (ELECTROSURGICAL) ×2 IMPLANT
FORCEPS BIOP RJ4 1.8 (CUTTING FORCEPS) IMPLANT
GAUZE SPONGE 4X4 12PLY STRL (GAUZE/BANDAGES/DRESSINGS) ×3 IMPLANT
GLOVE BIO SURGEON STRL SZ 6.5 (GLOVE) ×6 IMPLANT
GOWN STRL REUS W/ TWL LRG LVL3 (GOWN DISPOSABLE) ×6 IMPLANT
GOWN STRL REUS W/TWL LRG LVL3 (GOWN DISPOSABLE) ×3
KIT BASIN OR (CUSTOM PROCEDURE TRAY) ×3 IMPLANT
KIT CLEAN ENDO COMPLIANCE (KITS) ×3 IMPLANT
KIT ROOM TURNOVER OR (KITS) ×3 IMPLANT
KIT SUCTION CATH 14FR (SUCTIONS) ×3 IMPLANT
MARKER SKIN DUAL TIP RULER LAB (MISCELLANEOUS) ×3 IMPLANT
NEEDLE BIOPSY TRANSBRONCH 21G (NEEDLE) IMPLANT
NS IRRIG 1000ML POUR BTL (IV SOLUTION) ×6 IMPLANT
OIL SILICONE PENTAX (PARTS (SERVICE/REPAIRS)) ×3 IMPLANT
PACK CHEST (CUSTOM PROCEDURE TRAY) ×3 IMPLANT
PAD ARMBOARD 7.5X6 YLW CONV (MISCELLANEOUS) ×6 IMPLANT
PASSER SUT SWANSON 36MM LOOP (INSTRUMENTS) IMPLANT
RELOAD STAPLER GOLD 60MM (STAPLE) ×4 IMPLANT
RUBBERBAND STERILE (MISCELLANEOUS) ×3 IMPLANT
SEALANT PROGEL (MISCELLANEOUS) IMPLANT
SEALANT SURG COSEAL 4ML (VASCULAR PRODUCTS) IMPLANT
SEALANT SURG COSEAL 8ML (VASCULAR PRODUCTS) IMPLANT
SOLUTION ANTI FOG 6CC (MISCELLANEOUS) ×3 IMPLANT
STAPLE ECHEON FLEX 60 POW ENDO (STAPLE) ×3 IMPLANT
STAPLER RELOAD GOLD 60MM (STAPLE) ×6
SUT ETHILON 3 0 FSL (SUTURE) ×3 IMPLANT
SUT PROLENE 3 0 SH DA (SUTURE) IMPLANT
SUT PROLENE 4 0 RB 1 (SUTURE)
SUT PROLENE 4-0 RB1 .5 CRCL 36 (SUTURE) IMPLANT
SUT SILK  1 MH (SUTURE) ×5
SUT SILK 1 MH (SUTURE) ×10 IMPLANT
SUT SILK 2 0SH CR/8 30 (SUTURE) IMPLANT
SUT SILK 3 0SH CR/8 30 (SUTURE) IMPLANT
SUT VIC AB 1 CTX 18 (SUTURE) IMPLANT
SUT VIC AB 1 CTX 36 (SUTURE)
SUT VIC AB 1 CTX36XBRD ANBCTR (SUTURE) IMPLANT
SUT VIC AB 2-0 CTX 36 (SUTURE) IMPLANT
SUT VIC AB 2-0 UR6 27 (SUTURE) ×6 IMPLANT
SUT VIC AB 3-0 X1 27 (SUTURE) ×3 IMPLANT
SUT VICRYL 2 TP 1 (SUTURE) IMPLANT
SWAB COLLECTION DEVICE MRSA (MISCELLANEOUS) IMPLANT
SYR 20ML ECCENTRIC (SYRINGE) ×3 IMPLANT
SYSTEM SAHARA CHEST DRAIN ATS (WOUND CARE) ×3 IMPLANT
TAPE CLOTH 4X10 WHT NS (GAUZE/BANDAGES/DRESSINGS) ×3 IMPLANT
TAPE CLOTH SURG 4X10 WHT LF (GAUZE/BANDAGES/DRESSINGS) ×3 IMPLANT
TIP APPLICATOR SPRAY EXTEND 16 (VASCULAR PRODUCTS) IMPLANT
TOWEL OR 17X24 6PK STRL BLUE (TOWEL DISPOSABLE) ×3 IMPLANT
TOWEL OR 17X26 10 PK STRL BLUE (TOWEL DISPOSABLE) ×6 IMPLANT
TRAP SPECIMEN MUCOUS 40CC (MISCELLANEOUS) ×3 IMPLANT
TRAY FOLEY CATH SILVER 16FR LF (SET/KITS/TRAYS/PACK) ×3 IMPLANT
TROCAR XCEL BLUNT TIP 100MML (ENDOMECHANICALS) IMPLANT
TUBE ANAEROBIC SPECIMEN COL (MISCELLANEOUS) IMPLANT
TUBE CONNECTING 20X1/4 (TUBING) ×6 IMPLANT
WATER STERILE IRR 1000ML POUR (IV SOLUTION) ×6 IMPLANT

## 2016-01-29 NOTE — Progress Notes (Signed)
Cardiothoracic surgeon to take over care. Would like to thank Dr. Tyrone Sage for his assistance with this case.  Edward Mcknight, [employer]

## 2016-01-29 NOTE — Anesthesia Procedure Notes (Addendum)
Procedure Name: Intubation Date/Time: 01/29/2016 11:46 AM Performed by: Little Ishikawa L Pre-anesthesia Checklist: Patient identified, Emergency Drugs available, Suction available, Patient being monitored and Timeout performed Patient Re-evaluated:Patient Re-evaluated prior to inductionOxygen Delivery Method: Circle system utilized Preoxygenation: Pre-oxygenation with 100% oxygen Intubation Type: IV induction Ventilation: Mask ventilation without difficulty Laryngoscope Size: Mac and 4 Grade View: Grade I Tube type: Oral Endobronchial tube: Double lumen EBT, EBT position confirmed by auscultation and EBT position confirmed by fiberoptic bronchoscope and 39 Fr Number of attempts: 1 Placement Confirmation: ETT inserted through vocal cords under direct vision,  positive ETCO2 and breath sounds checked- equal and bilateral Tube secured with: Tape Dental Injury: Teeth and Oropharynx as per pre-operative assessment  Comments: For video bronchoscopy, a 8.5 OETT inserted prior to DLT without difficulty. BBS, +etco2, bilateral chest rise noted. Replaced with DLT    Procedure Name: LMA Insertion Date/Time: 01/29/2016 1:44 PM Performed by: Little Ishikawa L Pre-anesthesia Checklist: Patient identified, Emergency Drugs available, Suction available, Patient being monitored and Timeout performed Patient Re-evaluated:Patient Re-evaluated prior to inductionOxygen Delivery Method: Circle system utilized Preoxygenation: Pre-oxygenation with 100% oxygen Ventilation: Two handed mask ventilation required LMA: LMA inserted LMA Size: 4.0 Number of attempts: 1 Placement Confirmation: positive ETCO2 and breath sounds checked- equal and bilateral Dental Injury: Teeth and Oropharynx as per pre-operative assessment  Comments: LMA inserted after extubation of OETT by Dr. Sampson Goon due to inadequate respirations and ventilation.

## 2016-01-29 NOTE — Op Note (Signed)
[name], Edward Mcknight NO.:  [id number]  MEDICAL RECORD NO.:  [id number]  LOCATION:  3S16C                        FACILITY:  [facility]  PHYSICIAN:  Sheliah Plane, MD    DATE OF BIRTH:  December 06, 1991  DATE OF PROCEDURE:  01/29/2016 DATE OF DISCHARGE:                              OPERATIVE REPORT   PREOPERATIVE DIAGNOSIS:  Spontaneous left pneumothorax with continued air leak.  POSTOPERATIVE DIAGNOSIS:  Spontaneous left pneumothorax with continued air leak.  SURGICAL PROCEDURE:  Bronchoscopy, left video-assisted thoracoscopy, stapling of apical blebs, and mechanical pleurodesis.  SURGEON:  Sheliah Plane, M.D.  FIRST ASSISTANT:  Doree Fudge, PA  BRIEF HISTORY:  The patient is a 25 year old male, who on January 23, 2016, while living in [place], [place] developed shortness of breath and was seen in [facility] and found to have a pneumothorax. Local treating physician there placed a left chest tube.  By the patient's history, this chest tube fell out the following day, and it was replaced back through the same site.  The patient was watched in the hospital until Saturday, January 26, 2016, when he was transferred to [facility] because of continued air leak.  A CT scan was performed that showed the chest tube was in reasonable position.  Though the patient continued to have air leak, a left video-assisted thoracoscopy was recommend to the patient who agreed and signed informed consent.  DESCRIPTION OF PROCEDURE:  With A line and IVs in place, the patient underwent general endotracheal anesthesia without incident.  Through the double-lumen endotracheal tube and after appropriate time-out was performed, a  fiberoptic bronchoscopy was performed confirming good position of the double-lumen tube and without evidence of endobronchial lesions.  Scope was then removed.  The patient was turned in lateral decubitus position with left side up.  Left  chest was prepped with Betadine and draped in sterile manner.  A second time-out was confirmed and we proceeded with left video-assisted thoracoscopy.  The previously placed chest tube had been removed.  A new port site approximately at the sixth intercostal space midaxillary line was developed and through this, a 30-degree videoscope was introduced. Careful examination of the chest revealed no definite tube injury to the lung from the previous placement.  There was area of obvious bleb at the apex of the lung.  Two additional port sites were then created, one anterior and one posterior and through these 2 port sites, we then proceeded to localize the obvious apical bleb and stapled across it with 2 firings of stapler. The specimen was removed and submitted to Pathology.  The head was put in down position and sterile saline was instilled in the chest and the lung gently re-inflated without evidence of air leak.  The fluid was removed.  We then performed a mechanical pleurodesis.  A single 28 chest tube was left through the initial port site.  The 2 remaining port sites were then closed with 2-0 Vicryl in the deep muscle and a 3-0 subcuticular stitch.  There was no air leak at the completion of the procedure, and the lung re-inflated nicely.  The incision from the initial chest tube was then closed with interrupted  nylon sutures.  The patient tolerated the procedure with minimal blood loss, was extubated in the operating room and transferred to the recovery room for further postoperative care.     Sheliah Plane, MD     EG/MEDQ  D:  01/29/2016  T:  01/29/2016  Job:  [id number]

## 2016-01-29 NOTE — Anesthesia Preprocedure Evaluation (Addendum)
Anesthesia Evaluation  Patient identified by MRN, date of birth, ID band Patient awake    Reviewed: Allergy & Precautions, H&P , NPO status , Patient's Chart, lab work & pertinent test results  Airway Mallampati: II  TM Distance: >3 FB Neck ROM: Full    Dental no notable dental hx. (+) Teeth Intact, Dental Advisory Given   Pulmonary neg pulmonary ROS, former smoker,    Pulmonary exam normal breath sounds clear to auscultation       Cardiovascular negative cardio ROS   Rhythm:Regular Rate:Normal     Neuro/Psych negative neurological ROS  negative psych ROS   GI/Hepatic negative GI ROS, Neg liver ROS,   Endo/Other  negative endocrine ROS  Renal/GU negative Renal ROS  negative genitourinary   Musculoskeletal   Abdominal   Peds  (+) ADHD Hematology negative hematology ROS (+)   Anesthesia Other Findings   Reproductive/Obstetrics negative OB ROS                            Anesthesia Physical Anesthesia Plan  ASA: II  Anesthesia Plan: General   Post-op Pain Management:    Induction: Intravenous  Airway Management Planned: Double Lumen EBT  Additional Equipment: Arterial line  Intra-op Plan:   Post-operative Plan: Extubation in OR  Informed Consent: I have reviewed the patients History and Physical, chart, labs and discussed the procedure including the risks, benefits and alternatives for the proposed anesthesia with the patient or authorized representative who has indicated his/her understanding and acceptance.   Dental advisory given  Plan Discussed with: CRNA  Anesthesia Plan Comments:         Anesthesia Quick Evaluation

## 2016-01-29 NOTE — Progress Notes (Signed)
Report to M. Brande RN as primary caregiver 

## 2016-01-29 NOTE — Brief Op Note (Addendum)
      [address]       [name] [address]             [phone]    01/29/2016  1:26 PM  PATIENT:  Edward Mcknight  25 y.o. male  PRE-OPERATIVE DIAGNOSIS:  LEFT SPONTANEOUS PTX  POST-OPERATIVE DIAGNOSIS:  LEFT SPONTANEOUS PTX  PROCEDURE:  VIDEO BRONCHOSCOPY, LEFT VIDEO ASSISTED THORACOSCOPY, STAPELING of LEFT APICAL BLEB, MECHANICAL PLEURODESIS  SURGEON:  Surgeon(s) and Role:    * Delight Ovens, MD - Primary  PHYSICIAN ASSISTANT: Doree Fudge PA-C  ANESTHESIA:   general  EBL:  Total I/O In: 1000 [I.V.:1000] Out: 600 [Urine:600]  BLOOD ADMINISTERED:none  DRAINS: 2 28 French chest tubes placed in the left pleural space   SPECIMEN:  Source of Specimen:  Left apical bleb  DISPOSITION OF SPECIMEN:  PATHOLOGY  COUNTS CORRECT:  YES  DICTATION: .Dragon Dictation  PLAN OF CARE: Admit to inpatient   PATIENT DISPOSITION:  PACU - hemodynamically stable.   Delay start of Pharmacological VTE agent (>24hrs) due to surgical blood loss or risk of bleeding: yes

## 2016-01-29 NOTE — Transfer of Care (Signed)
Immediate Anesthesia Transfer of Care Note  Patient: Edward Mcknight  Procedure(s) Performed: Procedure(s): VIDEO BRONCHOSCOPY (N/A) VIDEO ASSISTED THORACOSCOPY (Left)  Patient Location: PACU  Anesthesia Type:General  Level of Consciousness: awake  Airway & Oxygen Therapy: Patient Spontanous Breathing and Patient connected to nasal cannula oxygen  Post-op Assessment: Report given to RN and Post -op Vital signs reviewed and stable  Post vital signs: stable  Last Vitals:  Filed Vitals:   01/29/16 0327 01/29/16 1418  BP: 120/82   Pulse: 63 94  Temp: 36.4 C 37.2 C  Resp: 16     Complications: No apparent anesthesia complications

## 2016-01-29 NOTE — Care Management Note (Signed)
Case Management Note Donn Pierini RN, BSN Unit 2W-Case Manager [phone]  Patient Details  Name: Edward Mcknight MRN: [id number] Date of Birth: 05/17/1991  Subjective/Objective:    Pt admitted with spont. pntx- chest tube placed, plan for VATS tomorrow               Action/Plan: PTA pt lived at home- independent- anticipate return home post op - CM to follow post op for progression and needs  Expected Discharge Date:                  Expected Discharge Plan:  Home/Self Care  In-House Referral:     Discharge planning Services  CM Consult  Post Acute Care Choice:    Choice offered to:     DME Arranged:    DME Agency:     HH Arranged:    HH Agency:     Status of Service:  In process, will continue to follow  Medicare Important Message Given:    Date Medicare IM Given:    Medicare IM give by:    Date Additional Medicare IM Given:    Additional Medicare Important Message give by:     If discussed at Long Length of Stay Meetings, dates discussed:    Additional Comments:  Darrold Span, RN 01/29/2016, 9:47 AM

## 2016-01-29 NOTE — Anesthesia Postprocedure Evaluation (Signed)
Anesthesia Post Note  Patient: Edward Mcknight  Procedure(s) Performed: Procedure(s) (LRB): VIDEO BRONCHOSCOPY (N/A) VIDEO ASSISTED THORACOSCOPY (Left)  Patient location during evaluation: PACU Anesthesia Type: General Level of consciousness: awake and alert Pain management: pain level controlled Vital Signs Assessment: post-procedure vital signs reviewed and stable Respiratory status: spontaneous breathing, nonlabored ventilation, respiratory function stable and patient connected to nasal cannula oxygen Cardiovascular status: blood pressure returned to baseline and stable Postop Assessment: no signs of nausea or vomiting Anesthetic complications: no    Last Vitals:  Filed Vitals:   01/29/16 1548 01/29/16 1552  BP: 131/66   Pulse:  112  Temp:    Resp:  21    Last Pain:  Filed Vitals:   01/29/16 1553  PainSc: 4                  Makel Mcmann,W. EDMOND

## 2016-01-29 NOTE — Progress Notes (Signed)
Care of pt assumed by MA Okie Jansson RN from M. Brande RN 

## 2016-01-30 ENCOUNTER — Encounter (HOSPITAL_COMMUNITY): Payer: Self-pay | Admitting: Cardiothoracic Surgery

## 2016-01-30 ENCOUNTER — Inpatient Hospital Stay (HOSPITAL_COMMUNITY): Payer: Self-pay

## 2016-01-30 LAB — GLUCOSE, CAPILLARY
Glucose-Capillary: 151 mg/dL — ABNORMAL HIGH (ref 65–99)
Glucose-Capillary: 100 mg/dL — ABNORMAL HIGH (ref 65–99)

## 2016-01-30 LAB — BASIC METABOLIC PANEL
ANION GAP: 10 (ref 5–15)
BUN: 10 mg/dL (ref 6–20)
CHLORIDE: 102 mmol/L (ref 101–111)
CO2: 25 mmol/L (ref 22–32)
Calcium: 9.1 mg/dL (ref 8.9–10.3)
Creatinine, Ser: 0.8 mg/dL (ref 0.61–1.24)
GFR calc Af Amer: >60 mL/min (ref >60)
GLUCOSE: 132 mg/dL — AB (ref 65–99)
POTASSIUM: 4.1 mmol/L (ref 3.5–5.1)
Sodium: 137 mmol/L (ref 135–145)

## 2016-01-30 LAB — BLOOD GAS, ARTERIAL
Acid-Base Excess: 0.2 mmol/L (ref 0.0–2.0)
BICARBONATE: 24.3 meq/L — AB (ref 20.0–24.0)
O2 Content: 5 L/min
O2 SAT: 99.2 %
PATIENT TEMPERATURE: 98.6
PCO2 ART: 39 mmHg (ref 35.0–45.0)
PH ART: 7.41 (ref 7.350–7.450)
PO2 ART: 167 mmHg — AB (ref 80.0–100.0)
TCO2: 25.5 mmol/L (ref 0–100)

## 2016-01-30 LAB — CBC
HEMATOCRIT: 37.6 % — AB (ref 39.0–52.0)
HEMOGLOBIN: 12.7 g/dL — AB (ref 13.0–17.0)
MCH: 31.1 pg (ref 26.0–34.0)
MCHC: 33.8 g/dL (ref 30.0–36.0)
MCV: 92.2 fL (ref 78.0–100.0)
Platelets: 163 10*3/uL (ref 150–400)
RBC: 4.08 MIL/uL — ABNORMAL LOW (ref 4.22–5.81)
RDW: 12 % (ref 11.5–15.5)
WBC: 14.3 10*3/uL — ABNORMAL HIGH (ref 4.0–10.5)

## 2016-01-30 NOTE — Care Management Note (Signed)
Case Management Note  Patient Details  Name: Edward Mcknight MRN: [id number] Date of Birth: 1991/10/03  Subjective/Objective:    Date: 01/30/16 Spoke with patient at the bedside.  Introduced self as Sports coach and explained role in discharge planning and how to be reached.  Verified patient lives in [place] with some friends and family, pta indep. Expressed potential need for no other DME.  Verified patient anticipates to go home with family and friends, at time of discharge and will have  part-time supervision by family friends neighbors at this time to best of their knowledge.  Patient confirmed needing help with their medication.  Patient drives  Or is driven by family and friends to MD appointments.  Verified patient has no  PCP- will set up apt with [facility] tomorrow for patient. Plan: CM will continue to follow for discharge planning and [facility] resources.                 Action/Plan:   Expected Discharge Date:                  Expected Discharge Plan:  Home/Self Care  In-House Referral:     Discharge planning Services  CM Consult  Post Acute Care Choice:    Choice offered to:     DME Arranged:    DME Agency:     HH Arranged:    HH Agency:     Status of Service:  In process, will continue to follow  Medicare Important Message Given:    Date Medicare IM Given:    Medicare IM give by:    Date Additional Medicare IM Given:    Additional Medicare Important Message give by:     If discussed at Long Length of Stay Meetings, dates discussed:    Additional Comments:  Leone Haven, RN 01/30/2016, 5:13 PM

## 2016-01-30 NOTE — Progress Notes (Signed)
Offered to walk with patient.  He stated he will "wait until my mom gets here, then I will".  Pt up ad lib in room performing self care activities, including walking in room to complete tasks.

## 2016-01-30 NOTE — Discharge Instructions (Signed)
Thoracoscopy, Care After °Refer to this sheet in the next few weeks. These instructions provide you with information about caring for yourself after your procedure. Your health care provider may also give you more specific instructions. Your treatment has been planned according to current medical practices, but problems sometimes occur. Call your health care provider if you have any problems or questions after your procedure. °WHAT TO EXPECT AFTER THE PROCEDURE: °After your procedure, it is common to feel sore for up to two weeks. °HOME CARE INSTRUCTIONS °· There are many different ways to close and cover an incision, including stitches (sutures), skin glue, and adhesive strips. Follow your health care provider's instructions about: °¨ Incision care. °¨ Bandage (dressing) changes and removal. °¨ Incision closure removal. °· Check your incision area every day for signs of infection. Watch for: °¨ Redness, swelling, or pain. °¨ Fluid, blood, or pus. °· Take medicines only as directed by your health care provider. °· Try to cough often. Coughing helps to protect against lung infection (pneumonia). It may hurt to cough. If this happens, hold a pillow against your chest when you cough. °· Take deep breaths. This also helps to protect against pneumonia. °· If you were given an incentive spirometer, use it as directed by your health care provider. °· Do not take baths, swim, or use a hot tub until your health care provider approves. You may take showers. °· Avoid lifting until your health care provider approves. °· Avoid driving until your health care provider approves. °· Do not travel by airplane after the chest tube is removed until your health care provider approves. °SEEK MEDICAL CARE IF: °· You have a fever. °· Pain medicines do not ease your pain. °· You have redness, swelling, or increasing pain in your incision area. °· You develop a cough that does not go away, or you are coughing up mucus that is yellow or  green. °SEEK IMMEDIATE MEDICAL CARE IF: °· You have fluid, blood, or pus coming from your incision. °· There is a bad smell coming from your incision or dressing. °· You develop a rash. °· You have difficulty breathing. °· You cough up blood. °· You develop light-headedness or you feel faint. °· You develop chest pain. °· Your heartbeat feels irregular or very fast. °  °This information is not intended to replace advice given to you by your health care provider. Make sure you discuss any questions you have with your health care provider. °  °Document Released: 06/27/2005 Document Revised: 12/29/2014 Document Reviewed: 08/23/2014 °Elsevier Interactive Patient Education ©2016 [employer] ° °

## 2016-01-30 NOTE — Discharge Summary (Signed)
Physician Discharge Summary       [address]       [name] [address]             [phone]    Patient ID: Edward Mcknight MRN: [id number] DOB/AGE: 17-Aug-1991 25 y.o.  Admit date: 01/26/2016 Discharge date: 01/31/2016  Admission Diagnosis: Left spontaneous pneumothorax with air leak  Active Diagnoses:  Tobacco abuse   Procedure (s):  Bronchoscopy, left video-assisted thoracoscopy, stapling of apical blebs, and mechanical pleurodesis by Dr. Tyrone Sage.  Pathology:   History of Presenting Illness: Previously healthy who developed sob spontaneously 4 days ago. Since was found to have pneumothorax and was transitioned here from different hospital for recommendations by CT surgeon. He reports no other new complaints. Pt has chest tube in place  Brief Hospital Course:  A line and foley were removed on post op day one. There was no air leak from the chest tube. Daily chest x rays were obtained and remained stable. Chest tube was placed to water seal 2/8 and then removed 2/9. He is ambulating on room air. He is tolerating a diet. He is felt surgically stable for today.   Latest Vital Signs: Blood pressure 121/78, pulse 87, temperature 98.2 F (36.8 C), temperature source Oral, resp. rate 26, height  (1.803 m), weight 150 lb 9.2 oz (68.3 kg), SpO2 99 %.  Physical Exam: General appearance: alert, cooperative and no distress Heart: regular rate and rhythm Lungs: clear to auscultation bilaterally Abdomen: soft, non-tender; bowel sounds normal; no masses, no organomegaly Wound: clean and dry  Discharge Condition: Stable and discharged to home  Recent laboratory studies:  Lab Results  Component Value Date   WBC 9.3 01/31/2016   HGB 12.4* 01/31/2016   HCT 37.6* 01/31/2016   MCV 92.8 01/31/2016   PLT 180 01/31/2016   Lab Results  Component Value Date   NA 140 01/31/2016   K 3.6 01/31/2016   CL 102 01/31/2016   CO2 28 01/31/2016   CREATININE 0.79  01/31/2016   GLUCOSE 112* 01/31/2016    Diagnostic Studies: Ct Chest Wo Contrast  01/28/2016  CLINICAL DATA:  Follow-up pneumothorax status post chest tube insertion EXAM: CT CHEST WITHOUT CONTRAST TECHNIQUE: Multidetector CT imaging of the chest was performed following the standard protocol without IV contrast. COMPARISON:  Chest radiograph dated 02/07/2016 at 0640 hours FINDINGS: Mediastinum/Nodes: The heart is normal in size. No pericardial effusion. No suspicious mediastinal lymphadenopathy. Visualized thyroid is unremarkable. Lungs/Pleura: Left chest tube abuts the left heart border, tracking along the fissure. Residual small left pneumothorax. Mild patchy opacity in the left lower lobe, likely dependent atelectasis. Mild centrilobular/ paraseptal emphysematous changes in the bilateral lung apices. Right lung is otherwise clear. 4 mm left lower lobe pulmonary nodule (series 3/ image 39), likely benign given the patient's young age. No pleural effusion. Upper abdomen: Visualized upper abdomen is within normal limits. Musculoskeletal: Subcutaneous emphysema along the left lateral chest wall. No rib fracture is seen. Visualized osseous structures are within normal limits. IMPRESSION: Indwelling left chest tube, as above. Residual small left pneumothorax. Associated subcutaneous emphysema along the left lateral chest wall. Mild emphysematous changes in the bilateral lung apices. Electronically Signed   By: Charline Bills M.D.   On: 01/28/2016 10:44   Dg Chest Port 1 View  01/30/2016  CLINICAL DATA:  LEFT pneumothorax. EXAM: PORTABLE CHEST 1 VIEW COMPARISON:  01/29/2016 FINDINGS: LEFT chest tube in place with with small LEFT apical pneumothorax which is not appreciably  changed in volume. The pleura edge is difficult define. Estimated volume pneumothorax is 5-10%. Slight improvement in central venous congestion. IMPRESSION: 1. Persistent LEFT apical pneumothorax with chest tube in place. 2. Improvement  central venous congestion. Electronically Signed   By: Genevive Bi M.D.   On: 01/30/2016 07:54   Discharge Medications:   Medication List    STOP taking these medications        acetaminophen 500 MG tablet  Commonly known as:  TYLENOL      TAKE these medications        ALEVE 220 MG tablet  Generic drug:  naproxen sodium  Take 660 mg by mouth daily as needed (pain/ migraines).     BC HEADACHE POWDER PO  Take 1 packet by mouth daily as needed (migraine).     oxyCODONE 5 MG immediate release tablet  Commonly known as:  Oxy IR/ROXICODONE  Take 1-2 tablets (5-10 mg total) by mouth every 6 (six) hours as needed for severe pain.        Follow Up Appointments: Follow-up Information    Follow up with Delight Ovens, MD On 02/14/2016.   Specialty:  Cardiothoracic Surgery   Why:  PA/LAT CXR to be taken (at [facility] which is in the same building as Dr. Dennie Maizes office) on 02/14/2016 at 1:45 pm ;Appointment time is at 2:30 pm   Contact information:   [address] [employer] [address] [place] [address] [phone]       Follow up with [facility] On 02/06/2016.   Why:  3 pm for hospital follow up   Contact information:   [address] [place] [address] [phone]      Follow up with [facility] [place].   Specialty:  Cardiothoracic Surgery   Why:  office will contact you about suture removal appointment   Contact information:   [address] [place], [address] [place] [address] [phone]      Signed: Rogers Blocker 01/31/2016, 3:29 PM

## 2016-01-30 NOTE — Progress Notes (Addendum)
      [address]       [name] [address]             [phone]      1 Day Post-Op Procedure(s) (LRB): VIDEO BRONCHOSCOPY (N/A) VIDEO ASSISTED THORACOSCOPY (Left)   Subjective:  Mr. Edward Mcknight states he feels good.  He did have some episodes of nausea after using PCA.  He asks if we can discontinue this.  He would also like his foley catheter removed.  Objective: Vital signs in last 24 hours: Temp:  [97.9 F (36.6 C)-99 F (37.2 C)] 97.9 F (36.6 C) (02/08 0746) Pulse Rate:  [62-112] 62 (02/08 0410) Cardiac Rhythm:  [-] Normal sinus rhythm (02/08 0410) Resp:  [15-37] 18 (02/08 0410) BP: (107-138)/(66-81) 112/68 mmHg (02/08 0410) SpO2:  [90 %-100 %] 100 % (02/08 0410) Arterial Line BP: (103-174)/(57-100) 119/62 mmHg (02/07 2230) Weight:  [150 lb 9.2 oz (68.3 kg)] 150 lb 9.2 oz (68.3 kg) (02/07 2230)  Intake/Output from previous day: 02/07 0701 - 02/08 0700 In: 2705 [P.O.:240; I.V.:2465] Out: 2770 [Urine:2225; Emesis/NG output:250; Blood:5; Chest Tube:290] Intake/Output this shift: Total I/O In: -  Out: 1000 [Urine:1000]  General appearance: alert, cooperative and no distress Heart: regular rate and rhythm Lungs: clear to auscultation bilaterally Abdomen: soft, non-tender; bowel sounds normal; no masses,  no organomegaly Wound: clean and dry  Lab Results:  Recent Labs  01/28/16 1612 01/29/16 1405 01/30/16 0331  WBC 10.2  --  14.3*  HGB 15.4 15.6 12.7*  HCT 45.2 46.0 37.6*  PLT 208  --  163   BMET:  Recent Labs  01/28/16 1612 01/29/16 1405 01/30/16 0331  NA 134* 140 137  K 4.2 4.5 4.1  CL 98*  --  102  CO2 26  --  25  GLUCOSE 98  --  132*  BUN 17  --  10  CREATININE 1.07  --  0.80  CALCIUM 9.6  --  9.1    PT/INR:  Recent Labs  01/28/16 1612  LABPROT 13.3  INR 0.99   ABG    Component Value Date/Time   PHART 7.410 01/30/2016 0345   HCO3 24.3* 01/30/2016 0345   TCO2 25.5 01/30/2016 0345   ACIDBASEDEF 4.0* 01/29/2016 1405     O2SAT 99.2 01/30/2016 0345   CBG (last 3)   Recent Labs  01/29/16 2340 01/30/16 0502  GLUCAP 124* 151*    Assessment/Plan: S/P Procedure(s) (LRB): VIDEO BRONCHOSCOPY (N/A) VIDEO ASSISTED THORACOSCOPY (Left)  1. Chest tube- no air leak, 332 cc output since surgery- leave on suction today, possibly place to water seal at midnight will discuss with Dr. Tyrone Sage 2. Pulm- no acute issues, not on oxygen, CXR shows 5% apical pneumothorax 3. D/C Arterial Line 4. D/C PCA, pain pills have been adequately controlling pain, and fentanyl was making him sick 5. D/C IV Fluids 6. D/C Foley Catheter 7. Dispo- patient stable, possibly place chest tube to water seal at midnight, repeat CXR in AM will discuss with staff   LOS: 4 days    Edward Mcknight 01/30/2016  I have seen and examined Edward Mcknight and agree with the above assessment  and plan.  Delight Ovens MD Beeper [phone] Office [phone] 01/30/2016 6:03 PM

## 2016-01-30 NOTE — Progress Notes (Signed)
20 ml's of fentanyl wasted,from PCA syringe. Osvaldo Human as second RN to verify.

## 2016-01-31 ENCOUNTER — Inpatient Hospital Stay (HOSPITAL_COMMUNITY): Payer: MEDICAID

## 2016-01-31 ENCOUNTER — Inpatient Hospital Stay (HOSPITAL_COMMUNITY): Payer: Self-pay

## 2016-01-31 LAB — CBC
HEMATOCRIT: 37.6 % — AB (ref 39.0–52.0)
Hemoglobin: 12.4 g/dL — ABNORMAL LOW (ref 13.0–17.0)
MCH: 30.6 pg (ref 26.0–34.0)
MCHC: 33 g/dL (ref 30.0–36.0)
MCV: 92.8 fL (ref 78.0–100.0)
Platelets: 180 10*3/uL (ref 150–400)
RBC: 4.05 MIL/uL — AB (ref 4.22–5.81)
RDW: 12.1 % (ref 11.5–15.5)
WBC: 9.3 10*3/uL (ref 4.0–10.5)

## 2016-01-31 LAB — COMPREHENSIVE METABOLIC PANEL
ALBUMIN: 3.2 g/dL — AB (ref 3.5–5.0)
ALT: 11 U/L — ABNORMAL LOW (ref 17–63)
ANION GAP: 10 (ref 5–15)
AST: 15 U/L (ref 15–41)
Alkaline Phosphatase: 48 U/L (ref 38–126)
BUN: 8 mg/dL (ref 6–20)
CHLORIDE: 102 mmol/L (ref 101–111)
CO2: 28 mmol/L (ref 22–32)
Calcium: 9.2 mg/dL (ref 8.9–10.3)
Creatinine, Ser: 0.79 mg/dL (ref 0.61–1.24)
GFR calc Af Amer: >60 mL/min (ref >60)
GFR calc non Af Amer: >60 mL/min (ref >60)
GLUCOSE: 112 mg/dL — AB (ref 65–99)
POTASSIUM: 3.6 mmol/L (ref 3.5–5.1)
SODIUM: 140 mmol/L (ref 135–145)
Total Bilirubin: 0.8 mg/dL (ref 0.3–1.2)
Total Protein: 5.8 g/dL — ABNORMAL LOW (ref 6.5–8.1)

## 2016-01-31 MED ORDER — OXYCODONE HCL 5 MG PO TABS: 5.0000 mg | ORAL_TABLET | Freq: Four times a day (QID) | ORAL | Status: DC | PRN

## 2016-01-31 NOTE — Progress Notes (Signed)
Pt has walked 30 laps around unit since coming to floor.

## 2016-01-31 NOTE — Care Management Note (Signed)
Case Management Note  Patient Details  Name: Edward Mcknight MRN: [id number] Date of Birth: 19-Apr-1991  Subjective/Objective:   Patient for dc home today, NCM went over with patient regarding apt at the [facility] about follow up and gave him the brochure, informed patient if he needed to cancel and change the apt time to call [facility].                  Action/Plan:   Expected Discharge Date:                  Expected Discharge Plan:  Home/Self Care  In-House Referral:     Discharge planning Services  CM Consult  Post Acute Care Choice:    Choice offered to:     DME Arranged:    DME Agency:     HH Arranged:    HH Agency:     Status of Service:  Completed, signed off  Medicare Important Message Given:    Date Medicare IM Given:    Medicare IM give by:    Date Additional Medicare IM Given:    Additional Medicare Important Message give by:     If discussed at Long Length of Stay Meetings, dates discussed:    Additional Comments:  Leone Haven, RN 01/31/2016, 5:33 PM

## 2016-01-31 NOTE — Progress Notes (Signed)
Pt received d/c instructions inc f/up visits, Rx, etc. Pt indicated understanding.

## 2016-01-31 NOTE — Progress Notes (Addendum)
[address]       [name] [address]             [phone]      2 Days Post-Op Procedure(s) (LRB): VIDEO BRONCHOSCOPY (N/A) VIDEO ASSISTED THORACOSCOPY (Left) Subjective: Feels well  Objective: Vital signs in last 24 hours: Temp:  [97.6 F (36.4 C)-98.6 F (37 C)] 98.3 F (36.8 C) (02/09 0732) Pulse Rate:  [68-94] 68 (02/09 0346) Cardiac Rhythm:  [-] Normal sinus rhythm (02/08 2123) Resp:  [15-25] 22 (02/09 0346) BP: (107-135)/(70-88) 113/73 mmHg (02/09 0346) SpO2:  [92 %-100 %] 92 % (02/09 0346)  Hemodynamic parameters for last 24 hours:    Intake/Output from previous day: 02/08 0701 - 02/09 0700 In: 240 [P.O.:240] Out: 2500 [Urine:2500] Intake/Output this shift:    General appearance: alert, cooperative and no distress Heart: regular rate and rhythm Lungs: minor ronchi, improves with cough Abdomen: benign Wound: incis ok  Lab Results:  Recent Labs  01/30/16 0331 01/31/16 0500  WBC 14.3* 9.3  HGB 12.7* 12.4*  HCT 37.6* 37.6*  PLT 163 180   BMET:  Recent Labs  01/30/16 0331 01/31/16 0500  NA 137 140  K 4.1 3.6  CL 102 102  CO2 25 28  GLUCOSE 132* 112*  BUN 10 8  CREATININE 0.80 0.79  CALCIUM 9.1 9.2    PT/INR:  Recent Labs  01/28/16 1612  LABPROT 13.3  INR 0.99   ABG    Component Value Date/Time   PHART 7.410 01/30/2016 0345   HCO3 24.3* 01/30/2016 0345   TCO2 25.5 01/30/2016 0345   ACIDBASEDEF 4.0* 01/29/2016 1405   O2SAT 99.2 01/30/2016 0345   CBG (last 3)   Recent Labs  01/29/16 2340 01/30/16 0502 01/30/16 1128  GLUCAP 124* 151* 100*    Meds Scheduled Meds: . acetaminophen  1,000 mg Oral 4 times per day   Or  . acetaminophen (TYLENOL) oral liquid 160 mg/5 mL  1,000 mg Oral 4 times per day  . antiseptic oral rinse  7 mL Mouth Rinse BID  . bisacodyl  10 mg Oral Daily  . pantoprazole  40 mg Oral Daily  . senna-docusate  1 tablet Oral QHS   Continuous Infusions:  PRN Meds:.ondansetron  (ZOFRAN) IV, oxyCODONE, potassium chloride, traMADol  Xrays Dg Chest 2 View  01/31/2016  CLINICAL DATA:  Left pneumothorax, followup examination EXAM: CHEST  2 VIEW COMPARISON:  01/30/2016 FINDINGS: Cardiac shadow is stable. The lungs are well aerated bilaterally. A left thoracostomy catheter is again noted with a tiny apical pneumothorax better visualized than that seen on the prior exam. Slight increased density is noted in the medial right lung base consistent with some right middle lobe atelectasis. This is new from the prior exam. IMPRESSION: Tiny residual apical left pneumothorax. Right middle lobe atelectasis new from the prior study. Electronically Signed   By: Alcide Clever M.D.   On: 01/31/2016 07:56   Dg Chest Port 1 View  01/30/2016  CLINICAL DATA:  LEFT pneumothorax. EXAM: PORTABLE CHEST 1 VIEW COMPARISON:  01/29/2016 FINDINGS: LEFT chest tube in place with with small LEFT apical pneumothorax which is not appreciably changed in volume. The pleura edge is difficult define. Estimated volume pneumothorax is 5-10%. Slight improvement in central venous congestion. IMPRESSION: 1. Persistent LEFT apical pneumothorax with chest tube in place. 2. Improvement central venous congestion. Electronically Signed   By: Genevive Bi M.D.   On: 01/30/2016 07:54   Dg Chest Port 1  View  01/29/2016  CLINICAL DATA:  Patient with history of left-sided pneumothorax. EXAM: PORTABLE CHEST 1 VIEW COMPARISON:  CT chest 01/28/2016; chest radiograph 07/27/2016. FINDINGS: The left chest tube has been repositioned. Subcutaneous emphysema overlying the left chest wall. Stable cardiac and mediastinal contours. Minimal heterogeneous opacities right mid and lower lung. No pleural effusion. Small left apical pneumothorax. IMPRESSION: Small left apical pneumothorax with left chest tube in place. Increased left chest wall subcutaneous emphysema. Heterogeneous opacities right mid and lower lung which may represent atelectasis,  aspiration or infection. Electronically Signed   By: Annia Belt M.D.   On: 01/29/2016 15:54    Assessment/Plan: S/P Procedure(s) (LRB): VIDEO BRONCHOSCOPY (N/A) VIDEO ASSISTED THORACOSCOPY (Left)  1 doing well. No air leak- d/c CT this am- check CXR in few hours and if ok poss d/c later today or in am    LOS: 5 days    GOLD,WAYNE E 01/31/2016  If chest xray ok this afternoon, will plan d/c home I have seen and examined Mackey Birchwood and agree with the above assessment  and plan.  Delight Ovens MD Beeper [phone] Office [phone] 01/31/2016 3:20 PM

## 2016-02-06 ENCOUNTER — Ambulatory Visit (HOSPITAL_BASED_OUTPATIENT_CLINIC_OR_DEPARTMENT_OTHER): Payer: Self-pay | Admitting: Critical Care Medicine

## 2016-02-06 DIAGNOSIS — J9383 Other pneumothorax: Secondary | ICD-10-CM

## 2016-02-06 NOTE — Progress Notes (Deleted)
   Subjective:    Patient ID: Edward Mcknight, male    DOB: 15-Jun-1991, 25 y.o.   MRN: [id number]  HPI    Review of Systems     Objective:   Physical Exam        Assessment & Plan:

## 2016-02-07 NOTE — Progress Notes (Signed)
Subjective:     Patient ID: Edward Mcknight, male   DOB: 1991-12-08, 25 y.o.   MRN: [id number]  HPI  PT was a no show  Review of Systems     Objective:   Physical Exam     Assessment:         Plan:

## 2016-02-13 ENCOUNTER — Other Ambulatory Visit: Payer: Self-pay | Admitting: Cardiothoracic Surgery

## 2016-02-13 DIAGNOSIS — J939 Pneumothorax, unspecified: Secondary | ICD-10-CM

## 2016-02-14 ENCOUNTER — Other Ambulatory Visit: Payer: Self-pay | Admitting: *Deleted

## 2016-02-14 ENCOUNTER — Ambulatory Visit
Admission: RE | Admit: 2016-02-14 | Discharge: 2016-02-14 | Disposition: A | Discharge status: Home / Self Care | Payer: No Typology Code available for payment source | Source: Ambulatory Visit | Attending: Cardiothoracic Surgery | Admitting: Cardiothoracic Surgery

## 2016-02-14 ENCOUNTER — Encounter: Payer: Self-pay | Admitting: Cardiothoracic Surgery

## 2016-02-14 ENCOUNTER — Telehealth: Payer: Self-pay | Admitting: *Deleted

## 2016-02-14 ENCOUNTER — Ambulatory Visit (INDEPENDENT_AMBULATORY_CARE_PROVIDER_SITE_OTHER): Payer: Self-pay | Admitting: Cardiothoracic Surgery

## 2016-02-14 VITALS — BP 126/81 | HR 79 | Resp 16 | Ht 71.0 in | Wt 144.0 lb

## 2016-02-14 DIAGNOSIS — J939 Pneumothorax, unspecified: Secondary | ICD-10-CM

## 2016-02-14 DIAGNOSIS — Z09 Encounter for follow-up examination after completed treatment for conditions other than malignant neoplasm: Secondary | ICD-10-CM

## 2016-02-14 DIAGNOSIS — J439 Emphysema, unspecified: Secondary | ICD-10-CM

## 2016-02-14 NOTE — Telephone Encounter (Signed)
Appointment letter returned for patient, phone number incorrect and address incorrect. Talked to patient's step mother, she will try and get in touch with patient.

## 2016-02-14 NOTE — Progress Notes (Signed)
[address]       [place] [address]             [phone]      Edward Mcknight [facility] Medical Record #[id number] Date of Birth: 1991-09-02  Referring: Penny Pia, MD Primary Care: No primary care provider on file.  Chief Complaint:   POST OP FOLLOW UP 01/29/2016  OPERATIVE REPORT PREOPERATIVE DIAGNOSIS: Spontaneous left pneumothorax with continued air leak. POSTOPERATIVE DIAGNOSIS: Spontaneous left pneumothorax with continued air leak. SURGICAL PROCEDURE: Bronchoscopy, left video-assisted thoracoscopy, stapling of apical blebs, and mechanical pleurodesis. SURGEON: Sheliah Plane, M.D.  History of Present Illness:     Patient returns after  Recent surgery for persistant airleak after PTX and chest tube placed in [place]. Since d/c not respiratory difficulity and has stopped smoking.     Past Medical History  Diagnosis Date  . Pneumothorax 01/23/2016  . ADHD (attention deficit hyperactivity disorder)      History  Smoking status  . Former Smoker -- 1.50 packs/day for 17 years  . Types: Cigarettes  . Quit date: 01/23/2016  Smokeless tobacco  . Current User  . Types: Chew    History  Alcohol Use  . Yes    Comment: occasional     Allergies  Allergen Reactions  . Cherry Nausea And Vomiting  . Wool Alcohol [Lanolin] Itching    Reaction to wool fabric    Current Outpatient Prescriptions  Medication Sig Dispense Refill  . Aspirin-Salicylamide-Caffeine (BC HEADACHE POWDER PO) Take 1 packet by mouth daily as needed (migraine).    . naproxen sodium (ALEVE) 220 MG tablet Take 660 mg by mouth daily as needed (pain/ migraines).    [name] oxyCODONE (OXY IR/ROXICODONE) 5 MG immediate release tablet Take 1-2 tablets (5-10 mg total) by mouth every 6 (six) hours as needed for severe pain. 20 tablet 0   No current facility-administered medications for this visit.       Physical Exam: BP 126/81 mmHg  Pulse 79  Resp 16  Ht   (1.803 m)  Wt 144 lb (65.318 kg)  BMI 20.09 kg/m2  SpO2 98%  General appearance: alert, cooperative and no distress Neurologic: intact Heart: regular rate and rhythm, S1, S2 normal, no murmur, click, rub or gallop Lungs: clear to auscultation bilaterally Abdomen: soft, non-tender; bowel sounds normal; no masses,  no organomegaly Extremities: extremities normal, atraumatic, no cyanosis or edema and Homans sign is negative, no sign of DVT Wound: left chest tube sites and port sites healing well   Diagnostic Studies & Laboratory data:     Recent Radiology Findings:   Dg Chest 2 View  02/14/2016  CLINICAL DATA:  Follow-up study from chest tube treatment of left-sided pneumothorax and subsequent thoracoscopy earlier this month ; patient reports slight pain on the left laterally at the site of the healing incision, former smoker. EXAM: CHEST  2 VIEW COMPARISON:  PA and lateral chest x-ray of January 31, 2016 FINDINGS: No pneumothorax is evident today. The lungs are well-expanded. The right middle lobe atelectasis has resolved. There is no focal infiltrate. The staple line is faintly visible in the left pulmonary apex. There is no pleural effusion. The heart and mediastinal structures are normal. The bony thorax is unremarkable. IMPRESSION: There is no residual pneumothorax or other cardiopulmonary abnormality. Electronically Signed   By: David  [place] M.D.   On: 02/14/2016 14:58      Recent Lab Findings: Lab Results  Component Value Date  WBC 9.3 01/31/2016   HGB 12.4* 01/31/2016   HCT 37.6* 01/31/2016   PLT 180 01/31/2016   GLUCOSE 112* 01/31/2016   ALT 11* 01/31/2016   AST 15 01/31/2016   NA 140 01/31/2016   K 3.6 01/31/2016   CL 102 01/31/2016   CREATININE 0.79 01/31/2016   BUN 8 01/31/2016   CO2 28 01/31/2016   INR 0.99 01/28/2016      Assessment / Plan:      Stable post op course Return to work tomorrow with no lifting of 25 lbs for two more weeks Return as  needed Congratulated on not smoking any more     Delight Ovens MD      [address] [place] [place] [address] Office [phone]   Beeper [phone]  02/14/2016 7:46 PM

## 2018-07-20 ENCOUNTER — Inpatient Hospital Stay (HOSPITAL_COMMUNITY): Payer: Self-pay

## 2018-07-20 ENCOUNTER — Encounter (HOSPITAL_COMMUNITY): Payer: Self-pay | Admitting: *Deleted

## 2018-07-20 ENCOUNTER — Inpatient Hospital Stay (HOSPITAL_COMMUNITY)
Admission: AD | Admit: 2018-07-20 | Discharge: 2018-07-24 | DRG: 165 | Disposition: A | Discharge status: Home / Self Care | Payer: Self-pay | Source: Other Acute Inpatient Hospital | Attending: Cardiothoracic Surgery | Admitting: Cardiothoracic Surgery

## 2018-07-20 ENCOUNTER — Other Ambulatory Visit: Payer: Self-pay

## 2018-07-20 DIAGNOSIS — J9383 Other pneumothorax: Principal | ICD-10-CM | POA: Diagnosis present

## 2018-07-20 DIAGNOSIS — J439 Emphysema, unspecified: Secondary | ICD-10-CM | POA: Diagnosis present

## 2018-07-20 DIAGNOSIS — F909 Attention-deficit hyperactivity disorder, unspecified type: Secondary | ICD-10-CM | POA: Diagnosis present

## 2018-07-20 DIAGNOSIS — E876 Hypokalemia: Secondary | ICD-10-CM | POA: Diagnosis present

## 2018-07-20 DIAGNOSIS — J939 Pneumothorax, unspecified: Secondary | ICD-10-CM

## 2018-07-20 DIAGNOSIS — Z9689 Presence of other specified functional implants: Secondary | ICD-10-CM

## 2018-07-20 DIAGNOSIS — J9311 Primary spontaneous pneumothorax: Secondary | ICD-10-CM

## 2018-07-20 DIAGNOSIS — I10 Essential (primary) hypertension: Secondary | ICD-10-CM | POA: Diagnosis present

## 2018-07-20 DIAGNOSIS — J9382 Other air leak: Secondary | ICD-10-CM | POA: Diagnosis present

## 2018-07-20 DIAGNOSIS — Z09 Encounter for follow-up examination after completed treatment for conditions other than malignant neoplasm: Secondary | ICD-10-CM

## 2018-07-20 DIAGNOSIS — F17220 Nicotine dependence, chewing tobacco, uncomplicated: Secondary | ICD-10-CM | POA: Diagnosis present

## 2018-07-20 LAB — TYPE AND SCREEN
ABO/RH(D): A POS
Antibody Screen: NEGATIVE

## 2018-07-20 LAB — CBC
HEMATOCRIT: 46 % (ref 39.0–52.0)
HEMOGLOBIN: 15.2 g/dL (ref 13.0–17.0)
MCH: 31 pg (ref 26.0–34.0)
MCHC: 33 g/dL (ref 30.0–36.0)
MCV: 93.7 fL (ref 78.0–100.0)
Platelets: 205 10*3/uL (ref 150–400)
RBC: 4.91 MIL/uL (ref 4.22–5.81)
RDW: 11.6 % (ref 11.5–15.5)
WBC: 11 10*3/uL — ABNORMAL HIGH (ref 4.0–10.5)

## 2018-07-20 LAB — BASIC METABOLIC PANEL
ANION GAP: 8 (ref 5–15)
BUN: 10 mg/dL (ref 6–20)
CHLORIDE: 102 mmol/L (ref 98–111)
CO2: 30 mmol/L (ref 22–32)
Calcium: 10.2 mg/dL (ref 8.9–10.3)
Creatinine, Ser: 1.04 mg/dL (ref 0.61–1.24)
GFR calc non Af Amer: >60 mL/min (ref >60)
Glucose, Bld: 110 mg/dL — ABNORMAL HIGH (ref 70–99)
POTASSIUM: 3.9 mmol/L (ref 3.5–5.1)
SODIUM: 140 mmol/L (ref 135–145)

## 2018-07-20 LAB — PROTIME-INR
INR: 0.97
Prothrombin Time: 12.8 seconds (ref 11.4–15.2)

## 2018-07-20 MED ORDER — OXYCODONE HCL 5 MG PO TABS
5.0000 mg | ORAL_TABLET | ORAL | Status: DC | PRN
  Administered 2018-07-20: 5 mg via ORAL
  Filled 2018-07-20 (×2): qty 1

## 2018-07-20 MED ORDER — ACETAMINOPHEN 650 MG RE SUPP: 650.0000 mg | Freq: Four times a day (QID) | RECTAL | Status: DC | PRN

## 2018-07-20 MED ORDER — SODIUM CHLORIDE 0.9% FLUSH
3.0000 mL | Freq: Two times a day (BID) | INTRAVENOUS | Status: DC
  Administered 2018-07-20: 3 mL via INTRAVENOUS

## 2018-07-20 MED ORDER — SODIUM CHLORIDE 0.9 % IV SOLN: 250.0000 mL | INTRAVENOUS | Status: DC | PRN

## 2018-07-20 MED ORDER — POLYETHYLENE GLYCOL 3350 17 G PO PACK: 17.0000 g | PACK | Freq: Every day | ORAL | Status: DC | PRN

## 2018-07-20 MED ORDER — ONDANSETRON HCL 4 MG PO TABS: 4.0000 mg | ORAL_TABLET | Freq: Four times a day (QID) | ORAL | Status: DC | PRN

## 2018-07-20 MED ORDER — SODIUM CHLORIDE 0.9 % IV SOLN
1.5000 g | INTRAVENOUS | Status: DC
  Filled 2018-07-20: qty 1.5

## 2018-07-20 MED ORDER — ONDANSETRON HCL 4 MG/2ML IJ SOLN: 4.0000 mg | Freq: Four times a day (QID) | INTRAMUSCULAR | Status: DC | PRN

## 2018-07-20 MED ORDER — SODIUM CHLORIDE 0.9% FLUSH: 3.0000 mL | INTRAVENOUS | Status: DC | PRN

## 2018-07-20 MED ORDER — ACETAMINOPHEN 325 MG PO TABS: 650.0000 mg | ORAL_TABLET | Freq: Four times a day (QID) | ORAL | Status: DC | PRN

## 2018-07-20 NOTE — Progress Notes (Signed)
      [address]       [place] [id number]             [phone]      Patient seen and examined. No air leak on current chest tube in place. Plan for video bronchoscopy, left VATS, stapling of apical blebs, and mechanical pleurodesis by Dr. Tyrone SageGerhardt on 07/21/2018. All additional questions answered to the patient's satisfaction.   Jari Favreessa Conte, PA-C

## 2018-07-20 NOTE — Progress Notes (Signed)
[facility] via [facility]. Patient was admitted with a Pneumothorax. He is alert and oriented, Vital signs are stable. Patient arrived with chest tube, hooked to suction, no leak Collection container showing 24ml. Patient has been placed on telelmetry, CCMD notified and 2nd verification done.

## 2018-07-20 NOTE — H&P (Signed)
[address]            [place] [id number]          [phone]     Edward BirchwoodRandy L Mcknight is an 27 y.o. male. 02/17/1991 GLO:[id number]   Chief Complaint: Right spontaneous pneuthorax  History of Presenting Illness:  This is a 27 year old male known to Dr. Tyrone SageGerhardt from previous bronchoscopy, left VATS, stapling of apical blebs, and mechanical pleurodesis on 02/08/2016. He was in his usual state of health until. He initially presented to [facility] [place]. A right chest tube was placed last week.Patient has had intermittent air leak since . CT of chest done .  I was  asked to accept the patient in transfer to [facility] for further evaluation and treatment.  Past Medical History: Past Medical History:  Diagnosis Date  . ADHD (attention deficit hyperactivity disorder)   . Pneumothorax 01/23/2016    Past Surgical History: Past Surgical History:  Procedure Laterality Date  . CHEST TUBE INSERTION Left 01/23/2016  . HERNIA REPAIR    . VIDEO ASSISTED THORACOSCOPY Left 01/29/2016   Procedure: VIDEO ASSISTED THORACOSCOPY;  Surgeon: Delight OvensEdward B Isaish Alemu, MD;  [facility];  Service: Thoracic;  Laterality: Left;  [name]. VIDEO BRONCHOSCOPY N/A 01/29/2016   Procedure: VIDEO BRONCHOSCOPY;  Surgeon: Delight OvensEdward B Amenah Tucci, MD;  [facility] OR;  Service: Thoracic;  Laterality: N/A;    Family History: No family history on file.   Social History:   reports that he quit smoking about 2 years ago. His smoking use included cigarettes. He has a 25.50 pack-year smoking history. His smokeless tobacco use includes chew. He reports that he drinks alcohol. He reports that he has current or past drug history. Drug: Marijuana.  Allergies:  Allergies  Allergen Reactions  . Cherry Nausea And Vomiting  . Wool Alcohol [Lanolin] Itching    Reaction to wool fabric    Medications Prior to Admission  Medication Sig Dispense Refill  . Aspirin-Salicylamide-Caffeine (BC  HEADACHE POWDER PO) Take 1 packet by mouth daily as needed (migraine).    . naproxen sodium (ALEVE) 220 MG tablet Take 660 mg by mouth daily as needed (pain/ migraines).    [name]. oxyCODONE (OXY IR/ROXICODONE) 5 MG immediate release tablet Take 1-2 tablets (5-10 mg total) by mouth every 6 (six) hours as needed for severe pain. 20 tablet 0    Review of Systems:  Cardiac Review of Systems: Y or N  Chest Pain [ n ] Resting SOB [ n ] Exertional SOB [ y ] Orthopnea y[ ]   Pedal Edema y[ ]  Palpitations [place]ienne[y ] Syncope [ ]  Presyncope [ n ]  General Review of Systems: [Y] = yes [ ] =no  Constitional: recent weight change [ ] ; anorexia [ ] ; fatigue [ ] ; nausea [ ] ; night sweats [ ] ; fever [ ] ; or chills [ ] ;  Dental: poor dentition[ ] ; Last Dentist visit 6 months  Eye : blurred vision [ ] ; diplopia [ ] ; vision changes [ ] ; Amaurosis fugax[ ] ;  Resp: cough [ ] ; wheezing[ ] ; hemoptysis[ ] ; shortness of breath[ ] ; paroxysmal nocturnal dyspnea[ ] ; dyspnea on exertion[ ] ; or orthopnea[ ] ;  GI: gallstones[ ] , vomiting[ ] ; dysphagia[ ] ; melena[ ] ; hematochezia [ ] ; heartburn[ ] ; Hx of Colonoscopy[ ] ;  GU: kidney stones [ ] ; hematuria[ ] ; dysuria [ ] ; nocturia[ ] ; history of obstruction [ ] ;  Skin: rash, swelling[ ] ;, hair loss[ ] ; peripheral edema[ ] ; or itching[ ] ;  Musculosketetal: myalgias[ ] ; joint swelling[ ] ; joint erythema[ ] ;  joint pain[ ] ; back pain[ ] ;  Heme/Lymph: bruising[ ] ; bleeding[ ] ; anemia[ ] ;  Neuro: TIA[ ] ; headaches[ ] ; stroke[ ] ; vertigo[ ] ; seizures[ ] ; paresthesias[ ] ; difficulty walking[ ] ;  Psych:depression[ ] ; anxiety[ ] ;  Endocrine: diabetes[ ] ; thyroid dysfunction[ ] ;  Immunizations: Flu Milo.Brash ]; Pneumococcal[n ];  Other:  BP 118/81 (BP Location: Left Arm)   Pulse 80   Temp 98.4 F (36.9 C) (Oral)   Resp 15   Ht 5\' 6"  (1.676 m)   Wt 167 lb (75.8 kg)   SpO2 97%   BMI 26.95 kg/m   Physical Exam: General appearance: alert, cooperative, appears stated age and no  distress Neurologic: intact Heart: regular rate and rhythm, S1, S2 normal, no murmur, click, rub or gallop Lungs: diminished breath sounds bibasilar Abdomen: soft, non-tender; bowel sounds normal; no masses,  no organomegaly Extremities: extremities normal, atraumatic, no cyanosis or edema and Homans sign is negative, no sign of DVT Wound: right chest tube in place, no air leak currently    Diagnostic Studies and Laboratory Results: CLINICAL DATA: Chest pain.  EXAM: CT CHEST WITHOUT CONTRAST  TECHNIQUE: Multidetector CT imaging of the chest was performed following the standard protocol without IV contrast.  COMPARISON: Radiograph of same day. CT scan of January 28, 2016.  FINDINGS: Cardiovascular: No significant vascular findings. Normal heart size. No pericardial effusion.  Mediastinum/Nodes: No enlarged mediastinal or axillary lymph nodes. Thyroid gland, trachea, and esophagus demonstrate no significant findings.  Lungs/Pleura: Moderate size right pneumothorax is noted as described on prior radiographs of same day. There are noted small blebs in the apices of both lungs. No pleural effusion is noted. No significant consolidative process is noted.  Upper Abdomen: No acute abnormality.  Musculoskeletal: No chest wall mass or suspicious bone lesions identified.  IMPRESSION: Moderate size right pneumothorax is noted as described on prior radiographs of same day. Small blebs are noted in the apices of both lungs.   Electronically Signed By: Lupita Raider, M.D. On: 07/15/2018 15:44     Patient has had chest x rays and CT of the chest done at [facility]. I have independently reviewed the above radiology studies  and reviewed the findings with the patient. I would judge the apical blebs as being more then "small"  2-3 cm    Assessment/Plan 1. Right spontaneous pneumothorax with right apical blebs and intermittent air leak for 5-6 days -I have recommended to  the patient we proceed with video bronchoscopy, right VATS, stapling of blebs, probable mechanical pleurodesis in am . 2. History of tobacco and marijuana use 3. History of left spontaneous pneumothorax-s/p bronchoscopy, left VATS, stapling of apical blebs, and mechanical pleurodesis by Dr. Tyrone Sage on 01/28/2018.   The goals risks and alternatives of the planned surgical procedurevideo bronchoscopy, right VATS, stapling of blebs,  mechanical pleurodesis  have been discussed with the patient in detail. The risks of the procedure including death, infection, stroke, myocardial infarction, bleeding, blood transfusion recurrent PTX  have all been discussed specifically.  I have quoted Edward Mcknight a 1 % of perioperative mortality and a complication rate as high as 10  %. The patient's questions have been answered.Edward Mcknight is willing  to proceed with the planned procedure.  Delight Ovens, MD 07/20/2018, 6:00 PM

## 2018-07-21 ENCOUNTER — Inpatient Hospital Stay (HOSPITAL_COMMUNITY): Payer: Self-pay | Admitting: Certified Registered"

## 2018-07-21 ENCOUNTER — Inpatient Hospital Stay (HOSPITAL_COMMUNITY): Payer: Self-pay

## 2018-07-21 ENCOUNTER — Encounter (HOSPITAL_COMMUNITY)
Admission: AD | Disposition: A | Discharge status: Home / Self Care | Payer: Self-pay | Attending: Cardiothoracic Surgery

## 2018-07-21 ENCOUNTER — Inpatient Hospital Stay (HOSPITAL_COMMUNITY)
Admission: RE | Admit: 2018-07-21 | Payer: No Typology Code available for payment source | Source: Ambulatory Visit | Admitting: Cardiothoracic Surgery

## 2018-07-21 DIAGNOSIS — J439 Emphysema, unspecified: Secondary | ICD-10-CM | POA: Diagnosis present

## 2018-07-21 HISTORY — PX: VIDEO BRONCHOSCOPY: SHX5072

## 2018-07-21 HISTORY — PX: VIDEO ASSISTED THORACOSCOPY: SHX5073

## 2018-07-21 HISTORY — PX: STAPLING OF BLEBS: SHX6429

## 2018-07-21 LAB — SURGICAL PCR SCREEN
MRSA, PCR: NEGATIVE
Staphylococcus aureus: NEGATIVE

## 2018-07-21 SURGERY — VIDEO ASSISTED THORACOSCOPY
Anesthesia: General | Site: Chest | Laterality: Right

## 2018-07-21 MED ORDER — OXYCODONE HCL 5 MG/5ML PO SOLN: 5.0000 mg | Freq: Once | ORAL | Status: DC | PRN

## 2018-07-21 MED ORDER — ROCURONIUM BROMIDE 10 MG/ML (PF) SYRINGE
PREFILLED_SYRINGE | INTRAVENOUS | Status: DC | PRN
  Administered 2018-07-21: 10 mg via INTRAVENOUS
  Administered 2018-07-21: 20 mg via INTRAVENOUS
  Administered 2018-07-21: 50 mg via INTRAVENOUS

## 2018-07-21 MED ORDER — FENTANYL 40 MCG/ML IV SOLN
INTRAVENOUS | Status: DC
  Administered 2018-07-21: 1000 ug via INTRAVENOUS
  Administered 2018-07-21: 160 ug via INTRAVENOUS
  Administered 2018-07-22: 260 ug via INTRAVENOUS
  Filled 2018-07-21: qty 25

## 2018-07-21 MED ORDER — OXYCODONE HCL 5 MG PO TABS
5.0000 mg | ORAL_TABLET | ORAL | Status: DC | PRN
  Administered 2018-07-22: 10 mg via ORAL
  Administered 2018-07-22: 5 mg via ORAL
  Administered 2018-07-23 (×3): 10 mg via ORAL
  Administered 2018-07-24: 5 mg via ORAL
  Filled 2018-07-21 (×6): qty 2
  Filled 2018-07-21: qty 1

## 2018-07-21 MED ORDER — ACETAMINOPHEN 160 MG/5ML PO SOLN: 1000.0000 mg | Freq: Four times a day (QID) | ORAL | Status: DC

## 2018-07-21 MED ORDER — ACETAMINOPHEN 500 MG PO TABS
1000.0000 mg | ORAL_TABLET | Freq: Four times a day (QID) | ORAL | Status: DC
  Administered 2018-07-22 – 2018-07-23 (×8): 1000 mg via ORAL
  Administered 2018-07-24: 500 mg via ORAL
  Filled 2018-07-21 (×9): qty 2

## 2018-07-21 MED ORDER — DEXAMETHASONE SODIUM PHOSPHATE 10 MG/ML IJ SOLN
INTRAMUSCULAR | Status: DC | PRN
  Administered 2018-07-21: 10 mg via INTRAVENOUS

## 2018-07-21 MED ORDER — LACTATED RINGERS IV SOLN
INTRAVENOUS | Status: DC | PRN
  Administered 2018-07-21 (×2): via INTRAVENOUS

## 2018-07-21 MED ORDER — ONDANSETRON HCL 4 MG/2ML IJ SOLN
INTRAMUSCULAR | Status: DC | PRN
  Administered 2018-07-21: 4 mg via INTRAVENOUS

## 2018-07-21 MED ORDER — ONDANSETRON HCL 4 MG/2ML IJ SOLN: 4.0000 mg | Freq: Four times a day (QID) | INTRAMUSCULAR | Status: DC | PRN

## 2018-07-21 MED ORDER — DIPHENHYDRAMINE HCL 12.5 MG/5ML PO ELIX
12.5000 mg | ORAL_SOLUTION | Freq: Four times a day (QID) | ORAL | Status: DC | PRN
  Filled 2018-07-21: qty 5

## 2018-07-21 MED ORDER — ALBUTEROL SULFATE (2.5 MG/3ML) 0.083% IN NEBU: 2.5000 mg | INHALATION_SOLUTION | RESPIRATORY_TRACT | Status: DC

## 2018-07-21 MED ORDER — FENTANYL CITRATE (PF) 250 MCG/5ML IJ SOLN
INTRAMUSCULAR | Status: DC | PRN
  Administered 2018-07-21 (×2): 50 ug via INTRAVENOUS
  Administered 2018-07-21: 250 ug via INTRAVENOUS

## 2018-07-21 MED ORDER — CEFAZOLIN SODIUM-DEXTROSE 1-4 GM/50ML-% IV SOLN
INTRAVENOUS | Status: DC | PRN
  Administered 2018-07-21: 2 g via INTRAVENOUS

## 2018-07-21 MED ORDER — SODIUM CHLORIDE 0.9% FLUSH: 9.0000 mL | INTRAVENOUS | Status: DC | PRN

## 2018-07-21 MED ORDER — HYDROMORPHONE HCL 1 MG/ML IJ SOLN
0.2500 mg | INTRAMUSCULAR | Status: DC | PRN
  Administered 2018-07-21 (×6): 0.5 mg via INTRAVENOUS

## 2018-07-21 MED ORDER — KETAMINE HCL 10 MG/ML IJ SOLN
INTRAMUSCULAR | Status: DC | PRN
  Administered 2018-07-21 (×3): 10 mg via INTRAVENOUS
  Administered 2018-07-21: 20 mg via INTRAVENOUS

## 2018-07-21 MED ORDER — ALBUTEROL SULFATE (2.5 MG/3ML) 0.083% IN NEBU
2.5000 mg | INHALATION_SOLUTION | RESPIRATORY_TRACT | Status: DC
  Administered 2018-07-21 – 2018-07-22 (×3): 2.5 mg via RESPIRATORY_TRACT
  Filled 2018-07-21 (×3): qty 3

## 2018-07-21 MED ORDER — 0.9 % SODIUM CHLORIDE (POUR BTL) OPTIME
TOPICAL | Status: DC | PRN
  Administered 2018-07-21: 2000 mL

## 2018-07-21 MED ORDER — TRAMADOL HCL 50 MG PO TABS
50.0000 mg | ORAL_TABLET | Freq: Four times a day (QID) | ORAL | Status: DC | PRN
  Administered 2018-07-21: 100 mg via ORAL
  Filled 2018-07-21 (×3): qty 2

## 2018-07-21 MED ORDER — ENOXAPARIN SODIUM 40 MG/0.4ML ~~LOC~~ SOLN
40.0000 mg | Freq: Every day | SUBCUTANEOUS | Status: DC
  Administered 2018-07-21: 40 mg via SUBCUTANEOUS
  Filled 2018-07-21 (×3): qty 0.4

## 2018-07-21 MED ORDER — DEXTROSE-NACL 5-0.9 % IV SOLN
INTRAVENOUS | Status: DC
  Administered 2018-07-21 – 2018-07-24 (×7): via INTRAVENOUS

## 2018-07-21 MED ORDER — OXYCODONE HCL 5 MG PO TABS: 5.0000 mg | ORAL_TABLET | Freq: Once | ORAL | Status: DC | PRN

## 2018-07-21 MED ORDER — PROPOFOL 10 MG/ML IV BOLUS
INTRAVENOUS | Status: DC | PRN
  Administered 2018-07-21: 200 mg via INTRAVENOUS

## 2018-07-21 MED ORDER — HYDROMORPHONE HCL 1 MG/ML IJ SOLN
INTRAMUSCULAR | Status: AC
  Filled 2018-07-21: qty 1

## 2018-07-21 MED ORDER — SENNOSIDES-DOCUSATE SODIUM 8.6-50 MG PO TABS
1.0000 | ORAL_TABLET | Freq: Every day | ORAL | Status: DC
  Administered 2018-07-21 – 2018-07-22 (×2): 1 via ORAL
  Filled 2018-07-21 (×3): qty 1

## 2018-07-21 MED ORDER — LIDOCAINE 2% (20 MG/ML) 5 ML SYRINGE
INTRAMUSCULAR | Status: DC | PRN
  Administered 2018-07-21: 80 mg via INTRAVENOUS

## 2018-07-21 MED ORDER — BISACODYL 5 MG PO TBEC
10.0000 mg | DELAYED_RELEASE_TABLET | Freq: Every day | ORAL | Status: DC
  Administered 2018-07-22: 10 mg via ORAL
  Filled 2018-07-21 (×3): qty 2

## 2018-07-21 MED ORDER — NALOXONE HCL 0.4 MG/ML IJ SOLN: 0.4000 mg | INTRAMUSCULAR | Status: DC | PRN

## 2018-07-21 MED ORDER — DEXMEDETOMIDINE HCL 200 MCG/2ML IV SOLN
INTRAVENOUS | Status: DC | PRN
  Administered 2018-07-21: 4 ug via INTRAVENOUS
  Administered 2018-07-21: 8 ug via INTRAVENOUS
  Administered 2018-07-21: 12 ug via INTRAVENOUS
  Administered 2018-07-21: 8 ug via INTRAVENOUS

## 2018-07-21 MED ORDER — POTASSIUM CHLORIDE 10 MEQ/50ML IV SOLN: 10.0000 meq | Freq: Every day | INTRAVENOUS | Status: DC | PRN

## 2018-07-21 MED ORDER — SUGAMMADEX SODIUM 200 MG/2ML IV SOLN
INTRAVENOUS | Status: DC | PRN
  Administered 2018-07-21: 200 mg via INTRAVENOUS

## 2018-07-21 MED ORDER — CEFAZOLIN SODIUM-DEXTROSE 2-4 GM/100ML-% IV SOLN
2.0000 g | Freq: Three times a day (TID) | INTRAVENOUS | Status: AC
  Administered 2018-07-21 – 2018-07-22 (×2): 2 g via INTRAVENOUS
  Filled 2018-07-21 (×2): qty 100

## 2018-07-21 MED ORDER — MIDAZOLAM HCL 2 MG/2ML IJ SOLN
INTRAMUSCULAR | Status: DC | PRN
  Administered 2018-07-21: 2 mg via INTRAVENOUS

## 2018-07-21 MED ORDER — DIPHENHYDRAMINE HCL 50 MG/ML IJ SOLN: 12.5000 mg | Freq: Four times a day (QID) | INTRAMUSCULAR | Status: DC | PRN

## 2018-07-21 SURGICAL SUPPLY — 84 items
APPLICATOR COTTON TIP 6 STRL (MISCELLANEOUS) ×3 IMPLANT
APPLICATOR COTTON TIP 6IN STRL (MISCELLANEOUS) ×4
APPLICATOR TIP COSEAL (VASCULAR PRODUCTS) IMPLANT
APPLICATOR TIP EXT COSEAL (VASCULAR PRODUCTS) IMPLANT
BLADE SURG 11 STRL SS (BLADE) IMPLANT
BRUSH CYTOL CELLEBRITY 1.5X140 (MISCELLANEOUS) IMPLANT
CANISTER SUCT 3000ML PPV (MISCELLANEOUS) ×4 IMPLANT
CATH KIT ON Q 5IN SLV (PAIN MANAGEMENT) IMPLANT
CATH THORACIC 28FR (CATHETERS) IMPLANT
CATH THORACIC 36FR (CATHETERS) IMPLANT
CATH THORACIC 36FR RT ANG (CATHETERS) IMPLANT
CLEANER TIP ELECTROSURG 2X2 (MISCELLANEOUS) ×8 IMPLANT
CLIP VESOCCLUDE MED 6/CT (CLIP) IMPLANT
CONN ST 1/4X3/8  BEN (MISCELLANEOUS) ×1
CONN ST 1/4X3/8 BEN (MISCELLANEOUS) ×3 IMPLANT
CONT SPEC 4OZ CLIKSEAL STRL BL (MISCELLANEOUS) ×12 IMPLANT
COVER BACK TABLE 60X90IN (DRAPES) ×4 IMPLANT
DERMABOND ADHESIVE PROPEN (GAUZE/BANDAGES/DRESSINGS) ×1
DERMABOND ADVANCED (GAUZE/BANDAGES/DRESSINGS)
DERMABOND ADVANCED .7 DNX12 (GAUZE/BANDAGES/DRESSINGS) IMPLANT
DERMABOND ADVANCED .7 DNX6 (GAUZE/BANDAGES/DRESSINGS) ×3 IMPLANT
DRAIN CHANNEL 28F RND 3/8 FF (WOUND CARE) ×4 IMPLANT
DRAPE LAPAROSCOPIC ABDOMINAL (DRAPES) ×4 IMPLANT
DRAPE SLUSH/WARMER DISC (DRAPES) ×4 IMPLANT
DRILL BIT 7/64X5 (BIT) IMPLANT
ELECT BLADE 4.0 EZ CLEAN MEGAD (MISCELLANEOUS) ×4
ELECT REM PT RETURN 9FT ADLT (ELECTROSURGICAL) ×4
ELECTRODE BLDE 4.0 EZ CLN MEGD (MISCELLANEOUS) ×3 IMPLANT
ELECTRODE REM PT RTRN 9FT ADLT (ELECTROSURGICAL) ×3 IMPLANT
FORCEPS BIOP RJ4 1.8 (CUTTING FORCEPS) IMPLANT
GAUZE SPONGE 2X2 8PLY STRL LF (GAUZE/BANDAGES/DRESSINGS) ×3 IMPLANT
GAUZE SPONGE 4X4 12PLY STRL (GAUZE/BANDAGES/DRESSINGS) ×4 IMPLANT
GLOVE BIO SURGEON STRL SZ 6.5 (GLOVE) ×8 IMPLANT
GOWN STRL REUS W/ TWL LRG LVL3 (GOWN DISPOSABLE) ×9 IMPLANT
GOWN STRL REUS W/TWL LRG LVL3 (GOWN DISPOSABLE) ×3
KIT BASIN OR (CUSTOM PROCEDURE TRAY) ×4 IMPLANT
KIT CLEAN ENDO COMPLIANCE (KITS) ×4 IMPLANT
KIT SUCTION CATH 14FR (SUCTIONS) ×8 IMPLANT
KIT TURNOVER KIT B (KITS) ×4 IMPLANT
MARKER SKIN DUAL TIP RULER LAB (MISCELLANEOUS) IMPLANT
NS IRRIG 1000ML POUR BTL (IV SOLUTION) ×8 IMPLANT
OIL SILICONE PENTAX (PARTS (SERVICE/REPAIRS)) ×4 IMPLANT
PACK CHEST (CUSTOM PROCEDURE TRAY) ×4 IMPLANT
PAD ARMBOARD 7.5X6 YLW CONV (MISCELLANEOUS) ×8 IMPLANT
PASSER SUT SWANSON 36MM LOOP (INSTRUMENTS) IMPLANT
SEALANT PROGEL (MISCELLANEOUS) IMPLANT
SEALANT SURG COSEAL 4ML (VASCULAR PRODUCTS) IMPLANT
SEALANT SURG COSEAL 8ML (VASCULAR PRODUCTS) IMPLANT
SLEEVE ENDOPATH XCEL 5M (ENDOMECHANICALS) ×4 IMPLANT
SOLUTION ANTI FOG 6CC (MISCELLANEOUS) ×4 IMPLANT
SPONGE GAUZE 2X2 STER 10/PKG (GAUZE/BANDAGES/DRESSINGS) ×1
STAPLE RELOAD 45MM GOLD (STAPLE) ×8 IMPLANT
STAPLER ECHELON POWERED (MISCELLANEOUS) ×4 IMPLANT
SUT ETHILON 3 0 FSL (SUTURE) ×4 IMPLANT
SUT PROLENE 3 0 SH DA (SUTURE) IMPLANT
SUT PROLENE 4 0 RB 1 (SUTURE)
SUT PROLENE 4-0 RB1 .5 CRCL 36 (SUTURE) IMPLANT
SUT SILK  1 MH (SUTURE) ×5
SUT SILK 1 MH (SUTURE) ×15 IMPLANT
SUT SILK 2 0SH CR/8 30 (SUTURE) IMPLANT
SUT SILK 3 0SH CR/8 30 (SUTURE) IMPLANT
SUT VIC AB 1 CTX 18 (SUTURE) IMPLANT
SUT VIC AB 1 CTX 36 (SUTURE)
SUT VIC AB 1 CTX36XBRD ANBCTR (SUTURE) IMPLANT
SUT VIC AB 2-0 CTX 36 (SUTURE) IMPLANT
SUT VIC AB 2-0 UR6 27 (SUTURE) ×8 IMPLANT
SUT VIC AB 3-0 X1 27 (SUTURE) ×8 IMPLANT
SUT VICRYL 2 TP 1 (SUTURE) IMPLANT
SWAB COLLECTION DEVICE MRSA (MISCELLANEOUS) IMPLANT
SWAB CULTURE ESWAB REG 1ML (MISCELLANEOUS) IMPLANT
SYR 20ML ECCENTRIC (SYRINGE) ×4 IMPLANT
SYSTEM SAHARA CHEST DRAIN ATS (WOUND CARE) ×4 IMPLANT
TAPE CLOTH 4X10 WHT NS (GAUZE/BANDAGES/DRESSINGS) ×4 IMPLANT
TIP APPLICATOR SPRAY EXTEND 16 (VASCULAR PRODUCTS) IMPLANT
TOWEL GREEN STERILE (TOWEL DISPOSABLE) ×4 IMPLANT
TOWEL GREEN STERILE FF (TOWEL DISPOSABLE) ×4 IMPLANT
TRAP SPECIMEN MUCOUS 40CC (MISCELLANEOUS) IMPLANT
TRAY FOLEY CATH SILVER 16FR LF (SET/KITS/TRAYS/PACK) ×4 IMPLANT
TROCAR BLADELESS 12MM (ENDOMECHANICALS) IMPLANT
TROCAR XCEL BLUNT TIP 100MML (ENDOMECHANICALS) ×4 IMPLANT
TUBE CONNECTING 20X1/4 (TUBING) ×4 IMPLANT
TUNNELER SHEATH ON-Q 11GX8 DSP (PAIN MANAGEMENT) IMPLANT
VALVE DISPOSABLE (MISCELLANEOUS) ×4 IMPLANT
WATER STERILE IRR 1000ML POUR (IV SOLUTION) ×8 IMPLANT

## 2018-07-21 NOTE — Anesthesia Procedure Notes (Signed)
Procedure Name: Intubation Date/Time: 07/21/2018 7:58 AM Performed by: Teressa Lower., CRNA Pre-anesthesia Checklist: Patient identified, Emergency Drugs available, Suction available and Patient being monitored Patient Re-evaluated:Patient Re-evaluated prior to induction Oxygen Delivery Method: Circle system utilized Preoxygenation: Pre-oxygenation with 100% oxygen Induction Type: IV induction Ventilation: Mask ventilation without difficulty Laryngoscope Size: Mac and 3 Grade View: Grade I Tube type: Oral Endobronchial tube: Left and Double lumen EBT and 39 Fr Number of attempts: 1 Airway Equipment and Method: Stylet and Oral airway Placement Confirmation: ETT inserted through vocal cords under direct vision,  positive ETCO2 and breath sounds checked- equal and bilateral Secured at: 31 cm Tube secured with: Tape Dental Injury: Teeth and Oropharynx as per pre-operative assessment

## 2018-07-21 NOTE — Anesthesia Procedure Notes (Signed)
Arterial Line Insertion Start/End7/31/2019 7:07 AM, 07/21/2018 7:17 AM Performed by: Izola Priceockfield, Zetta Stoneman Walton Jr., CRNA, CRNA  Patient location: Pre-op. Preanesthetic checklist: patient identified, IV checked, site marked, risks and benefits discussed, surgical consent, monitors and equipment checked, pre-op evaluation, timeout performed and anesthesia consent Lidocaine 1% used for infiltration Right, radial was placed Catheter size: 20 G Hand hygiene performed , maximum sterile barriers used  and Seldinger technique used Allen's test indicative of satisfactory collateral circulation Attempts: 3 Procedure performed using ultrasound guided technique. Ultrasound Notes:anatomy identified, needle tip was noted to be adjacent to the nerve/plexus identified and no ultrasound evidence of intravascular and/or intraneural injection Following insertion, dressing applied and Biopatch. Post procedure assessment: normal and unchanged  Patient tolerated the procedure well with no immediate complications.

## 2018-07-21 NOTE — Progress Notes (Signed)
      [address]       [place] [id number]             [phone]    Pre Procedure note for inpatients:   Mackey BirchwoodRandy L Billiter has been scheduled for Procedure(s): VIDEO ASSISTED THORACOSCOPY (Right) STAPLING OF BLEBS (Right) VIDEO BRONCHOSCOPY (N/A) today. The various methods of treatment have been discussed with the patient. After consideration of the risks, benefits and treatment options the patient has consented to the planned procedure.   The patient has been seen and labs reviewed. There are no changes in the patient's condition to prevent proceeding with the planned procedure today.  Recent labs:  Lab Results  Component Value Date   WBC 11.0 (H) 07/20/2018   HGB 15.2 07/20/2018   HCT 46.0 07/20/2018   PLT 205 07/20/2018   GLUCOSE 110 (H) 07/20/2018   ALT 11 (L) 01/31/2016   AST 15 01/31/2016   NA 140 07/20/2018   K 3.9 07/20/2018   CL 102 07/20/2018   CREATININE 1.04 07/20/2018   BUN 10 07/20/2018   CO2 30 07/20/2018   INR 0.97 07/20/2018    Delight OvensEdward B Athalia Setterlund, MD 07/21/2018 7:33 AM

## 2018-07-21 NOTE — Anesthesia Preprocedure Evaluation (Signed)
Anesthesia Evaluation  Patient identified by MRN, date of birth, ID band Patient awake    Reviewed: Allergy & Precautions, H&P , NPO status , Patient's Chart, lab work & pertinent test results  Airway Mallampati: II   Neck ROM: full    Dental   Pulmonary former smoker,  blebs   breath sounds clear to auscultation       Cardiovascular negative cardio ROS   Rhythm:regular Rate:Normal     Neuro/Psych    GI/Hepatic   Endo/Other    Renal/GU      Musculoskeletal   Abdominal   Peds  Hematology   Anesthesia Other Findings   Reproductive/Obstetrics                             Anesthesia Physical Anesthesia Plan  ASA: II  Anesthesia Plan: General   Post-op Pain Management:    Induction: Intravenous  PONV Risk Score and Plan: 2 and Ondansetron, Dexamethasone, Midazolam and Treatment may vary due to age or medical condition  Airway Management Planned: Oral ETT and Double Lumen EBT  Additional Equipment: Arterial line  Intra-op Plan:   Post-operative Plan: Extubation in OR  Informed Consent: I have reviewed the patients History and Physical, chart, labs and discussed the procedure including the risks, benefits and alternatives for the proposed anesthesia with the patient or authorized representative who has indicated his/her understanding and acceptance.     Plan Discussed with: CRNA, Anesthesiologist and Surgeon  Anesthesia Plan Comments:         Anesthesia Quick Evaluation

## 2018-07-21 NOTE — Transfer of Care (Signed)
Immediate Anesthesia Transfer of Care Note  Patient: Mackey BirchwoodRandy L Cadmus  Procedure(s) Performed: RIGHT VIDEO ASSISTED THORACOSCOPY (Right Chest) STAPLING OF BLEBS WITH MECHANICAL PLERUODESIS (Right ) VIDEO BRONCHOSCOPY (N/A )  Patient Location: PACU  Anesthesia Type:General  Level of Consciousness: awake, alert  and oriented  Airway & Oxygen Therapy: Patient Spontanous Breathing and Patient connected to face mask oxygen  Post-op Assessment: Report given to RN and Post -op Vital signs reviewed and stable  Post vital signs: Reviewed and stable  Last Vitals:  Vitals Value Taken Time  BP 142/103 07/21/2018  9:39 AM  Temp    Pulse 92 07/21/2018  9:44 AM  Resp 23 07/21/2018  9:44 AM  SpO2 94 % 07/21/2018  9:44 AM  Vitals shown include unvalidated device data.  Last Pain:  Vitals:   07/21/18 0419  TempSrc: Oral  PainSc:          Complications: No apparent anesthesia complications

## 2018-07-21 NOTE — Anesthesia Postprocedure Evaluation (Signed)
Anesthesia Post Note  Patient: Edward Mcknight  Procedure(s) Performed: RIGHT VIDEO ASSISTED THORACOSCOPY (Right Chest) STAPLING OF BLEBS WITH MECHANICAL PLERUODESIS (Right ) VIDEO BRONCHOSCOPY (N/A )     Patient location during evaluation: PACU Anesthesia Type: General Level of consciousness: awake and alert Pain management: pain level controlled Vital Signs Assessment: post-procedure vital signs reviewed and stable Respiratory status: spontaneous breathing, nonlabored ventilation, respiratory function stable and patient connected to nasal cannula oxygen Cardiovascular status: blood pressure returned to baseline and stable Postop Assessment: no apparent nausea or vomiting Anesthetic complications: no    Last Vitals:  Vitals:   07/21/18 1445 07/21/18 1515  BP:    Pulse:    Resp: 20   Temp: 36.6 C 36.9 C  SpO2: 95%     Last Pain:  Vitals:   07/21/18 1440  TempSrc:   PainSc: 7                  Isella Slatten S

## 2018-07-21 NOTE — Progress Notes (Signed)
Patient interviewed in the preop area. Patient able to confirm name, DOB, procedure, allergies, npo status and 10/10 chest tube pain.   Yvetta CoderLeah Madylin Fairbank, RN

## 2018-07-21 NOTE — Op Note (Signed)
NAME: Edward Mcknight, Edward Mcknight MEDICAL RECORD [id number] ACCOUNT [id number] DATE OF BIRTH:21-Feb-1991 FACILITY: [facility] LOCATION: [facility] PHYSICIAN:Hatsuko Bizzarro Bari Tobiah Celestine, MD  OPERATIVE REPORT  DATE OF PROCEDURE:  07/21/2018  PREOPERATIVE DIAGNOSIS:  Right spontaneous pneumothorax with apical blebs.  POSTOPERATIVE DIAGNOSIS:  Right spontaneous pneumothorax with apical blebs.  SURGICAL PROCEDURES:  Bronchoscopy, right video-assisted thoracoscopy, stapling of apical bleb,  mechanical pleurodesis.  SURGEON:  Sheliah Plane, MD  FIRST ASSISTANT:  Gershon Crane, PA  BRIEF HISTORY:  The patient is a 27 year old male known to the thoracic surgery service from previous left video-assisted thoracoscopy and stapling of apical blebs for a spontaneous pneumothorax, 2  years previously.  The patient had, had no further  problems on the left.  He now presents in transfer from [facility] where he has been hospitalized for the past four days.  He presented with a spontaneous right pneumothorax.  A right chest tube was placed and since admission has had intermittent  air leak from the tube.  CT scan of the chest revealed moderate sized apical blebs on the right as the likely source for the spontaneous pneumothorax and air leak.  Risks and options were discussed with the patient and with the continued intermittent air  leak and the size of the blebs on CT and his previous history from the left side.  Bronchoscopy with right video-assisted thoracoscopy with stapling of blebs was recommended to the patient who agreed and signed informed consent.  DESCRIPTION OF PROCEDURE:  With arterial line in place, the patient underwent general endotracheal anesthesia without incident.  A double lumen endotracheal tube was placed.  Appropriate timeout was then performed and we proceeded with bronchoscopy to  the subsegmental level on the right and left tracheobronchial tree.  There were no endobronchial lesions noted.  The  endotracheal tube, double lumen was in good position.  The scope was removed.  The right lung was deflated.  The patient was placed in  the right lateral decubitus position with the right side up.  The right side had been preoperatively marked.  The  right chest tube was removed and the right chest prepped with Betadine and draped in the usual sterile manner.  We then proceeded with a  second timeout.  An incision was then made in the mid axillary line, approximately sixth intercostal space, a 5 mm port was placed and through this, a 5 mm videoscope was placed.  This gave good visualization of the right chest.  The middle and lower  lobes were without any obvious blebs.  On examination of the apical segments, there was a moderate sized multiloculated pulmonary bleb evident Although not actively leaking at the time, was enlarged and with slight inflammation suggestive of this being  the source of deair leak.  We then made an additional port incision anteriorly and posteriorly in the fourth intercostal space.  Through these 2 ports, the lung was manipulated and with the apical blebs well visualized, an Ethicon powered 45 mm gold-load  stapler was used to fire across the base of the apical blebs.  A second firing was required.  After this was completed, the specimen was brought out through the trocar site intact and submitted to pathology for permanent section.  The head was put in a  down position.  The chest was partially filled with warm saline.  The lung was ventilated.  There was no obvious air leak.  The irrigation was removed.Through the port sites a curved sponge stick was used with a small Bovie  scraper to perform a mechanical pleurodesis on the pleural surfaces of the upper portion of the chest.  The anterior and posterior port sites were closed with UR stitch through the central port site, a 28  Blake drain was placed and positioned into the right chest and secured.  The chest tube was placed to  suction.  The right lung was then ventilated and inflated well.  The chest tube was secured in place, the site of the old chest tube was closed with  several interrupted 3-0 Prolene sutures.  With the lung will reinflated and no evidence of recurrent air leak, the incisions on the right chest were treated with Dermabond.  A dressing was placed around the right chest tube site.    The patient was then awakened in the operating room and extubated having tolerated the procedure without obvious complication.  Sponge and needle count was reported as correct at completion of the procedure.    The patient tolerated the procedure without obvious complication.    PA assisted on the case both with manipulation of the camera, performing the stapling of blebs and assisted in wound closure.  AN/NUANCE  D:07/21/2018 T:07/21/2018 JOB:[id number]

## 2018-07-21 NOTE — Brief Op Note (Addendum)
      [address]       [place] [id number]             [phone]     07/21/2018  9:08 AM  PATIENT:  Mackey Birchwoodandy L Capece  27 y.o. male  PRE-OPERATIVE DIAGNOSIS:  RIGHT PTX WITH AIRLEAK BLEBS  POST-OPERATIVE DIAGNOSIS: SAME  PROCEDURE:  Procedure(s): RIGHT VIDEO ASSISTED THORACOSCOPY (Right) STAPLING OF BLEBS WITH MECHANICAL PLERUODESIS (Right) VIDEO BRONCHOSCOPY (N/A)  SURGEON:  Surgeon(s) and Role:    * [name], Zaydin Billey B, MD - Primary  PHYSICIAN ASSISTANT: WAYNE GOLD PA-C  ANESTHESIA:   general  EBL:  20 mL   BLOOD ADMINISTERED:none  DRAINS: 1 28 BARD Chest Tube(s) in the RIGHT HEMITHORAX   LOCAL MEDICATIONS USED:  NONE  SPECIMEN:  Source of Specimen:  RIGHT APICAL WEDGE RESECTION  DISPOSITION OF SPECIMEN:  PATHOLOGY  COUNTS:  YES   DICTATION: .Other Dictation: Dictation Number PENDING  PLAN OF CARE: Admit to inpatient   PATIENT DISPOSITION:  PACU - hemodynamically stable.   Delay start of Pharmacological VTE agent (>24hrs) due to surgical blood loss or risk of bleeding: yes  COMPLICATIONS: NO KNOWN

## 2018-07-22 ENCOUNTER — Encounter (HOSPITAL_COMMUNITY): Payer: Self-pay | Admitting: Cardiothoracic Surgery

## 2018-07-22 ENCOUNTER — Inpatient Hospital Stay (HOSPITAL_COMMUNITY): Payer: Self-pay

## 2018-07-22 LAB — BLOOD GAS, ARTERIAL
ACID-BASE EXCESS: 2.7 mmol/L — AB (ref 0.0–2.0)
Bicarbonate: 27.1 mmol/L (ref 20.0–28.0)
Drawn by: 23604
O2 CONTENT: 2 L/min
O2 SAT: 96 %
PCO2 ART: 44.4 mmHg (ref 32.0–48.0)
PH ART: 7.403 (ref 7.350–7.450)
Patient temperature: 98.6
pO2, Arterial: 80 mmHg — ABNORMAL LOW (ref 83.0–108.0)

## 2018-07-22 LAB — BASIC METABOLIC PANEL
Anion gap: 8 (ref 5–15)
BUN: 7 mg/dL (ref 6–20)
CALCIUM: 9.2 mg/dL (ref 8.9–10.3)
CHLORIDE: 105 mmol/L (ref 98–111)
CO2: 25 mmol/L (ref 22–32)
CREATININE: 0.79 mg/dL (ref 0.61–1.24)
GFR calc Af Amer: >60 mL/min (ref >60)
GFR calc non Af Amer: >60 mL/min (ref >60)
Glucose, Bld: 135 mg/dL — ABNORMAL HIGH (ref 70–99)
Potassium: 4.1 mmol/L (ref 3.5–5.1)
Sodium: 138 mmol/L (ref 135–145)

## 2018-07-22 LAB — CBC
HCT: 42.4 % (ref 39.0–52.0)
Hemoglobin: 13.9 g/dL (ref 13.0–17.0)
MCH: 30.8 pg (ref 26.0–34.0)
MCHC: 32.8 g/dL (ref 30.0–36.0)
MCV: 94 fL (ref 78.0–100.0)
PLATELETS: 202 10*3/uL (ref 150–400)
RBC: 4.51 MIL/uL (ref 4.22–5.81)
RDW: 11.6 % (ref 11.5–15.5)
WBC: 12.4 10*3/uL — AB (ref 4.0–10.5)

## 2018-07-22 MED ORDER — ALBUTEROL SULFATE (2.5 MG/3ML) 0.083% IN NEBU: 2.5000 mg | INHALATION_SOLUTION | RESPIRATORY_TRACT | Status: DC | PRN

## 2018-07-22 NOTE — Progress Notes (Addendum)
      [address]       [place] [id number]             [phone]      1 Day Post-Op Procedure(s) (LRB): RIGHT VIDEO ASSISTED THORACOSCOPY (Right) STAPLING OF BLEBS WITH MECHANICAL PLERUODESIS (Right) VIDEO BRONCHOSCOPY (N/A)   Subjective:  No new complaints.  Continues to have pain, relieved with pain medicaiton  Objective: Vital signs in last 24 hours: Temp:  [97.7 F (36.5 C)-98.6 F (37 C)] 98 F (36.7 C) (08/01 0713) Pulse Rate:  [70-102] 75 (08/01 0713) Cardiac Rhythm: Normal sinus rhythm (08/01 0700) Resp:  [14-35] 22 (08/01 0713) BP: (110-142)/(61-103) 130/86 (08/01 0713) SpO2:  [92 %-100 %] 100 % (08/01 0723) Arterial Line BP: (105-215)/(43-135) 137/70 (07/31 1430)  Intake/Output from previous day: 07/31 0701 - 08/01 0700 In: 2399.3 [P.O.:360; I.V.:1939.3; IV Piggyback:100] Out: 1900 [Urine:1750; Blood:20; Chest Tube:130] Intake/Output this shift: Total I/O In: -  Out: 750 [Urine:750]  General appearance: alert, cooperative and no distress Heart: regular rate and rhythm Lungs: clear to auscultation bilaterally Abdomen: soft, non-tender; bowel sounds normal; no masses,  no organomegaly Extremities: extremities normal, atraumatic, no cyanosis or edema Wound: clean and dry  Lab Results: Recent Labs    07/20/18 1837 07/22/18 0253  WBC 11.0* 12.4*  HGB 15.2 13.9  HCT 46.0 42.4  PLT 205 202   BMET:  Recent Labs    07/20/18 1837 07/22/18 0253  NA 140 138  K 3.9 4.1  CL 102 105  CO2 30 25  GLUCOSE 110* 135*  BUN 10 7  CREATININE 1.04 0.79  CALCIUM 10.2 9.2    PT/INR:  Recent Labs    07/20/18 1837  LABPROT 12.8  INR 0.97   ABG    Component Value Date/Time   PHART 7.403 07/22/2018 0440   HCO3 27.1 07/22/2018 0440   TCO2 25.5 01/30/2016 0345   ACIDBASEDEF 4.0 (H) 01/29/2016 1405   O2SAT 96.0 07/22/2018 0440   CBG (last 3)  No results for input(s): GLUCAP in the last 72 hours.  Assessment/Plan: S/P Procedure(s)  (LRB): RIGHT VIDEO ASSISTED THORACOSCOPY (Right) STAPLING OF BLEBS WITH MECHANICAL PLERUODESIS (Right) VIDEO BRONCHOSCOPY (N/A)  1. Chest tube- no air leak present, questionable small apical space on right- will place chest tube to water seal 2. D/C Foley catheter 3. CV- NSR, mildly HTN- will need outpatient follow up 4. Lovenox for DVT 5. Dispo- patient stable, chest tube to water seal, d/c foley, repeat CXR in AM, if remains stable will d/c chest tube and plan for discharge home on Saturday or Friday afternoon   LOS: 2 days    Edward Mcknight 07/22/2018  To 4e  Ct to water seal D/c pca pump I have seen and examined Edward Mcknight and agree with the above assessment  and plan.  Delight OvensEdward B Ninfa Giannelli MD Bee[place] [phone] 07/22/2018 8:59 AM

## 2018-07-22 NOTE — Plan of Care (Signed)
  Problem: Health Behavior/Discharge Planning: Goal: Ability to manage health-related needs will improve Outcome: Progressing   Problem: Clinical Measurements: Goal: Ability to maintain clinical measurements within normal limits will improve Outcome: Progressing Goal: Will remain free from infection Outcome: Progressing Goal: Diagnostic test results will improve Outcome: Progressing Goal: Respiratory complications will improve Outcome: Progressing Goal: Cardiovascular complication will be avoided Outcome: Progressing   Problem: Activity: Goal: Risk for activity intolerance will decrease Outcome: Progressing   Problem: Coping: Goal: Level of anxiety will decrease Outcome: Progressing   Problem: Elimination: Goal: Will not experience complications related to bowel motility Outcome: Progressing Goal: Will not experience complications related to urinary retention Outcome: Progressing   Problem: Pain Managment: Goal: General experience of comfort will improve Outcome: Progressing   Problem: Skin Integrity: Goal: Risk for impaired skin integrity will decrease Outcome: Progressing   Problem: Education: Goal: Knowledge of disease or condition will improve Outcome: Progressing Goal: Knowledge of the prescribed therapeutic regimen will improve Outcome: Progressing   Problem: Activity: Goal: Risk for activity intolerance will decrease Outcome: Progressing   Problem: Cardiac: Goal: Will achieve and/or maintain hemodynamic stability Outcome: Progressing   Problem: Clinical Measurements: Goal: Postoperative complications will be avoided or minimized Outcome: Progressing   Problem: Respiratory: Goal: Respiratory status will improve Outcome: Progressing   Problem: Pain Management: Goal: Pain level will decrease Outcome: Progressing   Problem: Skin Integrity: Goal: Wound healing without signs and symptoms infection will improve Outcome: Progressing

## 2018-07-22 NOTE — Discharge Summary (Signed)
Physician Discharge Summary  Patient ID: Edward Mcknight MRN: [id number] DOB/AGE: Dec 02, 1991 27 y.o.  Admit date: 07/20/2018 Discharge date: 07/24/2018  Admission Diagnoses:  Patient Active Problem List   Diagnosis Date Noted  . Lung blebs (HCC) 07/21/2018  . Pneumothorax, right 07/20/2018  . Pneumothorax on right 07/20/2018  . Pneumothorax, left 01/29/2016  . Pneumothorax 01/26/2016   S/P Left VATS with Bleb Resection and Mechanical Pleurodesis     Discharge Diagnoses:   Patient Active Problem List   Diagnosis Date Noted   S/P Right VATS with Bleb Resection and Mechanical Pleurodesis   . Lung blebs (HCC) 07/21/2018  . Pneumothorax, right 07/20/2018  . Pneumothorax on right 07/20/2018  . Pneumothorax, left 01/29/2016  . Pneumothorax 01/26/2016   S/P Left VATS with Bleb Resection and Mechanical Pleurodesis    Discharged Condition: good  History of Present Illness:  Mr. Edward Mcknight is a 28 yo white male well known to [facility].  He is S/P Left VATS with bleb resection and mechanical pleurodesis performed 01/29/2016 by Dr. Tyrone Sage.  He was in his usual state of health until presenting to [facility] in [place] last week, with complaints of right sided chest pain and shortness of breath.  He was found to have a pneumothorax and chest tube was placed.  He continued to have an intermittent air leak.  CT scan of the chest was done and showed evidence of bullous emphysema.  It was felt the patient would require VATS procedure and per his request he was transferred to [facility] for further care.  Hospital Course:   He was clinically stable on admission.  His right sided chest tube continued to have an air leak present.  Dr. Tyrone Sage felt VATs procedure would be indicated on the right side.  The risks and benefits of the procedure were explained to the patient and he was agreeable to proceed.  He was taken to the operating room on 07/21/2018.  He underwent Video Thoracoscopy with Right Video  Assisted Thoracoscopy with stapling of apical bleb and mechanical pleurodesis.  He tolerated the procedure without difficulty, was extubated and taken to the PACU in stable condition.  He did well post operatively.  His arterial line and foley catheter were removed without difficulty.  His CXR was free from pneumothorax.  His chest tube did not exhibit evidence of an air leak and was transitioned to water seal on 07/22/2018.  Follow up CXR showed Minimal right basilar atelectasis.  No recurrent pneumothorax noted.[name]  His chest tube and PCA were removed on 07/23/2018.  Follow up CXR remained clinically stable.  Hew as ambulating independently.  His incisions were healing without evidence on infection.  He is medically stable for discharge home today.  Treatments: surgery:    Bronchoscopy, right video-assisted thoracoscopy, stapling of apical bleb,  mechanical pleurodesis.  Discharge Exam: Blood pressure 108/80, pulse (!) 54, temperature 97.8 F (36.6 C), temperature source Oral, resp. rate 17, height 5\' 6"  (1.676 m), weight 75.8 kg (167 lb), SpO2 96 %.    General appearance: alert, cooperative and no distress Heart: regular rate and rhythm, S1, S2 normal, no murmur, click, rub or gallop Lungs: clear to auscultation bilaterally Abdomen: soft, non-tender; bowel sounds normal; no masses,  no organomegaly Extremities: extremities normal, atraumatic, no cyanosis or edema Wound: clean and dry     Disposition: Discharge disposition: 01-Home or Self Care     Home  Discharge Medications:   Allergies as of 07/24/2018  Reactions   Cherry Nausea And Vomiting   Wool Alcohol [lanolin] Itching   Reaction to wool fabric   Garlic Diarrhea   Morphine And Related Other (See Comments)   Burning sensation on entire body.      Medication List    STOP taking these medications   BC HEADACHE POWDER PO     TAKE these medications   acetaminophen 500 MG tablet Commonly known as:  TYLENOL Take 2  tablets (1,000 mg total) by mouth every 6 (six) hours.   traMADol 50 MG tablet Commonly known as:  ULTRAM Take 1 tablet (50 mg total) by mouth every 6 (six) hours as needed (mild pain).      Follow-up Information    [name], Edward B, MD Follow up on 08/05/2018.   Specialty:  Cardiothoracic Surgery Why:  Appointment is at 3:30, please get CXR at 3:00 at [facility] located on first floor of our office building Contact information: [address] [place] [address] [phone]        [facility] Call.   Why:  Please call and request post discharge follow up primary care appointment Contact information: [address] [place] [id number] [phone]       [facility] Follow up.   Why:  Additional option for Primary Care Provider establishment Contact information: [address] [place] [phone]       [facility] Follow up.   Why:  An addtional option for a primary care provider Contact information: [address] [place] [phone]          Signed: Sharlene Doryessa N Alvia Jablonski 07/24/2018, 9:37 AM

## 2018-07-22 NOTE — Progress Notes (Signed)
4mL Fentanyl 40mg /mL wasted down the sink with Llana AlimentHayley Deaton, RN

## 2018-07-23 ENCOUNTER — Inpatient Hospital Stay (HOSPITAL_COMMUNITY): Payer: Self-pay

## 2018-07-23 LAB — CBC
HEMATOCRIT: 40.4 % (ref 39.0–52.0)
HEMOGLOBIN: 13.5 g/dL (ref 13.0–17.0)
MCH: 31 pg (ref 26.0–34.0)
MCHC: 33.4 g/dL (ref 30.0–36.0)
MCV: 92.9 fL (ref 78.0–100.0)
Platelets: 197 10*3/uL (ref 150–400)
RBC: 4.35 MIL/uL (ref 4.22–5.81)
RDW: 11.5 % (ref 11.5–15.5)
WBC: 10.4 10*3/uL (ref 4.0–10.5)

## 2018-07-23 LAB — COMPREHENSIVE METABOLIC PANEL
ALK PHOS: 59 U/L (ref 38–126)
ALT: 13 U/L (ref 0–44)
AST: 16 U/L (ref 15–41)
Albumin: 3.2 g/dL — ABNORMAL LOW (ref 3.5–5.0)
Anion gap: 7 (ref 5–15)
BILIRUBIN TOTAL: 0.6 mg/dL (ref 0.3–1.2)
BUN: 8 mg/dL (ref 6–20)
CALCIUM: 9 mg/dL (ref 8.9–10.3)
CO2: 26 mmol/L (ref 22–32)
CREATININE: 0.81 mg/dL (ref 0.61–1.24)
Chloride: 107 mmol/L (ref 98–111)
GFR calc Af Amer: >60 mL/min (ref >60)
Glucose, Bld: 119 mg/dL — ABNORMAL HIGH (ref 70–99)
POTASSIUM: 3.3 mmol/L — AB (ref 3.5–5.1)
Sodium: 140 mmol/L (ref 135–145)
Total Protein: 5.8 g/dL — ABNORMAL LOW (ref 6.5–8.1)

## 2018-07-23 NOTE — Care Management Note (Signed)
Case Management Note  Patient Details  Name: Edward BirchwoodRandy L Dieckman MRN: [id number] Date of Birth: 11/15/1991  Subjective/Objective:  Pt is /p VATS  Action/Plan:   PTA independent from home.  Pt confirmed he does not have insurance nor PCP.  Pt requested CM provided both [facility] information and [facility] in [place] on AVS.  Pt informed CM that he pays cost for medications.  Pt may benefit from [facility] at discharge if different medications other than PTA meds are prescribed.  CM will continue to follow    Expected Discharge Date:  07/26/18               Expected Discharge Plan:  Home/Self Care  In-House Referral:     Discharge planning Services  CM Consult  Post Acute Care Choice:    Choice offered to:     DME Arranged:    DME Agency:     HH Arranged:    HH Agency:     Status of Service:     If discussed at [employer] Meetings, dates discussed:    Additional Comments:  Cherylann ParrClaxton, Jesusa Stenerson S, RN 07/23/2018, 9:15 AM

## 2018-07-23 NOTE — Progress Notes (Addendum)
      [address]       [place] [id number]             [phone]      2 Days Post-Op Procedure(s) (LRB): RIGHT VIDEO ASSISTED THORACOSCOPY (Right) STAPLING OF BLEBS WITH MECHANICAL PLERUODESIS (Right) VIDEO BRONCHOSCOPY (N/A) Subjective: Feels a little short of breath and he is having pain at the chest tube site. He has been "walking all morning because he wants to get out of here"  Objective: Vital signs in last 24 hours: Temp:  [98.2 F (36.8 C)-98.5 F (36.9 C)] 98.2 F (36.8 C) (08/02 0713) Pulse Rate:  [62-85] 62 (08/02 0713) Cardiac Rhythm: Normal sinus rhythm (08/02 0744) Resp:  [17-32] 17 (08/02 0713) BP: (123-135)/(83-95) 126/83 (08/02 0713) SpO2:  [94 %-96 %] 95 % (08/02 0713)     Intake/Output from previous day: 08/01 0701 - 08/02 0700 In: 1633.3 [I.V.:1633.3] Out: 2790 [Urine:2650; Chest Tube:140] Intake/Output this shift: No intake/output data recorded.  General appearance: alert, cooperative and no distress Heart: sinus bradycardia Lungs: clear to auscultation bilaterally Abdomen: soft, non-tender; bowel sounds normal; no masses,  no organomegaly Extremities: extremities normal, atraumatic, no cyanosis or edema Wound: clean and dry  Lab Results: Recent Labs    07/22/18 0253 07/23/18 0402  WBC 12.4* 10.4  HGB 13.9 13.5  HCT 42.4 40.4  PLT 202 197   BMET:  Recent Labs    07/22/18 0253 07/23/18 0402  NA 138 140  K 4.1 3.3*  CL 105 107  CO2 25 26  GLUCOSE 135* 119*  BUN 7 8  CREATININE 0.79 0.81  CALCIUM 9.2 9.0    PT/INR:  Recent Labs    07/20/18 1837  LABPROT 12.8  INR 0.97   ABG    Component Value Date/Time   PHART 7.403 07/22/2018 0440   HCO3 27.1 07/22/2018 0440   TCO2 25.5 01/30/2016 0345   ACIDBASEDEF 4.0 (H) 01/29/2016 1405   O2SAT 96.0 07/22/2018 0440   CBG (last 3)  No results for input(s): GLUCAP in the last 72 hours.  Assessment/Plan: S/P Procedure(s) (LRB): RIGHT VIDEO ASSISTED  THORACOSCOPY (Right) STAPLING OF BLEBS WITH MECHANICAL PLERUODESIS (Right) VIDEO BRONCHOSCOPY (N/A)   1. Chest tube- no air leak present, questionable small apical space on right- no change on CXR today with chest tube on water seal. D/c chest tube today.  3. CV- NSR to SB, BP well controlled.  4. Lovenox for DVT 5. Dispo- patient stable, d/c chest tube and CXR in the morning. If stable, will plan to discharge tomorrow.     LOS: 3 days    Edward Mcknight 07/23/2018  Chest tube out Chest xray in am and likely d/c home  I have seen and examined Edward Mcknight and agree with the above assessment  and plan.  Edward OvensEdward B Mohammedali Bedoy MD Beeper [phone] Office [phone] 07/23/2018 1:17 PM

## 2018-07-23 NOTE — Plan of Care (Addendum)
Paged Dr.Gerhardt per pt request for muscle relaxer.  Dr. Rexanne ManoBrian Bartle on call.  No new orders.

## 2018-07-24 ENCOUNTER — Inpatient Hospital Stay (HOSPITAL_COMMUNITY): Payer: Self-pay

## 2018-07-24 MED ORDER — TRAMADOL HCL 50 MG PO TABS: 50.0000 mg | ORAL_TABLET | Freq: Four times a day (QID) | ORAL | 0 refills | Status: AC | PRN

## 2018-07-24 MED ORDER — ACETAMINOPHEN 500 MG PO TABS: 1000.0000 mg | ORAL_TABLET | Freq: Three times a day (TID) | ORAL | 0 refills | Status: AC | PRN

## 2018-07-24 MED ORDER — ACETAMINOPHEN 500 MG PO TABS: 1000.0000 mg | ORAL_TABLET | Freq: Four times a day (QID) | ORAL | 0 refills | Status: DC

## 2018-07-24 NOTE — Progress Notes (Signed)
      [address]       [place] [id number]             [phone]      3 Days Post-Op Procedure(s) (LRB): RIGHT VIDEO ASSISTED THORACOSCOPY (Right) STAPLING OF BLEBS WITH MECHANICAL PLERUODESIS (Right) VIDEO BRONCHOSCOPY (N/A) Subjective: Feels okay other than his left arm which is sore.   Objective: Vital signs in last 24 hours: Temp:  [97.8 F (36.6 C)-98.6 F (37 C)] 97.8 F (36.6 C) (08/03 0731) Pulse Rate:  [51-101] 54 (08/03 0731) Cardiac Rhythm: Sinus bradycardia (08/03 0810) Resp:  [15-26] 17 (08/03 0731) BP: (108-140)/(67-93) 108/80 (08/03 0731) SpO2:  [95 %-99 %] 96 % (08/03 0731)     Intake/Output from previous day: 08/02 0701 - 08/03 0700 In: 1943.3 [P.O.:240; I.V.:1703.3] Out: -  Intake/Output this shift: Total I/O In: 1523.3 [I.V.:1523.3] Out: 250 [Urine:250]  General appearance: alert, cooperative and no distress Heart: regular rate and rhythm, S1, S2 normal, no murmur, click, rub or gallop Lungs: clear to auscultation bilaterally Abdomen: soft, non-tender; bowel sounds normal; no masses,  no organomegaly Extremities: extremities normal, atraumatic, no cyanosis or edema Wound: clean and dry  Lab Results: Recent Labs    07/22/18 0253 07/23/18 0402  WBC 12.4* 10.4  HGB 13.9 13.5  HCT 42.4 40.4  PLT 202 197   BMET:  Recent Labs    07/22/18 0253 07/23/18 0402  NA 138 140  K 4.1 3.3*  CL 105 107  CO2 25 26  GLUCOSE 135* 119*  BUN 7 8  CREATININE 0.79 0.81  CALCIUM 9.2 9.0    PT/INR: No results for input(s): LABPROT, INR in the last 72 hours. ABG    Component Value Date/Time   PHART 7.403 07/22/2018 0440   HCO3 27.1 07/22/2018 0440   TCO2 25.5 01/30/2016 0345   ACIDBASEDEF 4.0 (H) 01/29/2016 1405   O2SAT 96.0 07/22/2018 0440   CBG (last 3)  No results for input(s): GLUCAP in the last 72 hours.  Assessment/Plan: S/P Procedure(s) (LRB): RIGHT VIDEO ASSISTED THORACOSCOPY (Right) STAPLING OF BLEBS WITH  MECHANICAL PLERUODESIS (Right) VIDEO BRONCHOSCOPY (N/A)   1. Chest tube removed yesterday without issue. CXR this morning showed no pneumothorax with removal.  2. CV- NSR to SB, BP well controlled.  3. Renal-creatinine 0.81, hypokalemia-replaced 4. Lovenox for DVT  Plan: Discharge today. Chest tube sutures will be removed at follow-up appointment      LOS: 4 days    Sharlene Doryessa N Emanuela Runnion 07/24/2018

## 2018-07-24 NOTE — Discharge Instructions (Signed)
Video-Assisted Thoracic Surgery, Care After ° °This sheet gives you information about how to care for yourself after your procedure. Your health care provider may also give you more specific instructions. If you have problems or questions, contact your health care provider. °What can I expect after the procedure? °After the procedure, it is common to have: °· Some pain and soreness in your chest. °· Pain when breathing in (inhaling) and coughing. °· Constipation. °· Fatigue. °· Difficulty sleeping. ° °Follow these instructions at home: °Preventing pneumonia °· Take deep breaths or do breathing exercises as instructed by your health care provider. Doing this helps prevent lung infection (pneumonia). °· Cough frequently. Coughing may cause discomfort, but it is important to clear mucus (phlegm) and expand your lungs. If it hurts to cough, hold a pillow against your chest or place the palms of both hands on top of the incision (use splinting) when you cough. This may help relieve discomfort. °· If you were given an incentive spirometer, use it as directed. An incentive spirometer is a tool that measures how well you are filling your lungs with each breath. °· Participate in pulmonary rehabilitation as directed by your health care provider. This is a program that combines education, exercise, and support from a team of specialists. The goal is to help you heal and get back to your normal activities as soon as possible. °Medicines °· Take over-the-counter or prescription medicines only as told by your health care provider. °· If you have pain, take pain-relieving medicine before your pain becomes severe. This is important because if your pain is under control, you will be able to breathe and cough more comfortably. °· If you were prescribed an antibiotic medicine, take it as told by your health care provider. Do not stop taking the antibiotic even if you start to feel better. °Activity °· Ask your health care provider  what activities are safe for you. °· Avoid activities that use your chest muscles for at least 3-4 weeks. °· Do not lift anything that is heavier than 10 lb (4.5 kg), or the limit that your health care provider tells you, until he or she says that it is safe. °Incision care °· Follow instructions from your health care provider about how to take care of your incision(s). Make sure you: °? Wash your hands with soap and water before you change your bandage (dressing). If soap and water are not available, use hand sanitizer. °? Change your dressing as told by your health care provider. °? Leave stitches (sutures), skin glue, or adhesive strips in place. These skin closures may need to stay in place for 2 weeks or longer. If adhesive strip edges start to loosen and curl up, you may trim the loose edges. Do not remove adhesive strips completely unless your health care provider tells you to do that. °· Keep your dressing dry until it has been removed. °· Check your incision area every day for signs of infection. Check for: °? Redness, swelling, or pain. °? Fluid or blood. °? Warmth. °? Pus or a bad smell. °Bathing °· Do not take baths, swim, or use a hot tub until your health care provider approves. You may take showers. °· After your dressing has been removed, use soap and water to gently wash your incision area. Do not use anything else to clean your incision(s) unless your health care provider tells you to do this. °Driving °· Do not drive until your health care provider approves. °· Do not drive   or use heavy machinery while taking prescription pain medicine. °Eating and drinking °· Eat a healthy, balanced diet as instructed by your health care provider. A healthy diet includes plenty of fresh fruits and vegetables, whole grains, and low-fat (lean) proteins. °· Limit foods that are high in fat and processed sugars, such as fried and sweet foods. °· Drink enough fluid to keep your urine clear or pale yellow. °General  instructions °· To prevent or treat constipation while you are taking prescription pain medicine, your health care provider may recommend that you: °? Take over-the-counter or prescription medicines. °? Eat foods that are high in fiber, such as beans, fresh fruits and vegetables, and whole grains. °· Do not use any products that contain nicotine or tobacco, such as cigarettes and e-cigarettes. If you need help quitting, ask your health care provider. °· Avoid secondhand smoke. °· Wear compression stockings as told by your health care provider. These stockings help to prevent blood clots and reduce swelling in your legs. °· If you have a chest tube, care for it as instructed by your health care provider. Do not travel by airplane during the 2 weeks after your chest tube is removed, or until your health care provider says that this is safe. °· Keep all follow-up visits as told by your health care provider. This is important. °Contact a health care provider if: °· You have redness, swelling, or pain around an incision. °· You have fluid or blood coming from an incision. °· Your incision area feels warm to the touch. °· You have pus or a bad smell coming from an incision. °· You have a fever or chills. °· You have nausea or vomiting. °· You have pain that does not get better with medicine. °Get help right away if: °· You have chest pain. °· Your heart is fluttering or beating rapidly. °· You develop a rash. °· You have shortness of breath or trouble breathing. °· You are confused. °· You have trouble speaking. °· You feel weak, light-headed, or dizzy. °· You faint. °Summary °· To help prevent lung infection (pneumonia), take deep breaths or do breathing exercises as instructed by your health care provider. °· Cough frequently to clear mucus (phlegm) and expand your lungs. If it hurts to cough, hold a pillow against your chest or place the palms of both hands on top of the incision (use splinting) when you cough. °· If  you have pain, take pain-relieving medicine before your pain becomes severe. This is important because if your pain is under control, you will be able to breathe and cough more comfortably. °· Ask your health care provider what activities are safe for you. °This information is not intended to replace advice given to you by your health care provider. Make sure you discuss any questions you have with your health care provider. °Document Released: 04/04/2013 Document Revised: 11/17/2016 Document Reviewed: 11/17/2016 °Elsevier Interactive Patient Education © 2017 [employer] ° °

## 2018-07-24 NOTE — Progress Notes (Signed)
IV's removed; belongings collected; educated on d/c instructions; prescriptions given; IS sent with pt; will wheel out once ready.   [place]  Atoya Andrew, RN

## 2018-08-05 ENCOUNTER — Ambulatory Visit
Admission: RE | Admit: 2018-08-05 | Discharge: 2018-08-05 | Disposition: A | Discharge status: Home / Self Care | Payer: Self-pay | Source: Ambulatory Visit | Attending: Cardiothoracic Surgery | Admitting: Cardiothoracic Surgery

## 2018-08-05 ENCOUNTER — Other Ambulatory Visit: Payer: Self-pay | Admitting: Cardiothoracic Surgery

## 2018-08-05 ENCOUNTER — Ambulatory Visit (INDEPENDENT_AMBULATORY_CARE_PROVIDER_SITE_OTHER): Payer: Self-pay | Admitting: Cardiothoracic Surgery

## 2018-08-05 ENCOUNTER — Encounter: Payer: Self-pay | Admitting: Cardiothoracic Surgery

## 2018-08-05 VITALS — BP 135/83 | HR 96 | Resp 20 | Ht 66.0 in | Wt 165.0 lb

## 2018-08-05 DIAGNOSIS — J439 Emphysema, unspecified: Secondary | ICD-10-CM

## 2018-08-05 DIAGNOSIS — Z09 Encounter for follow-up examination after completed treatment for conditions other than malignant neoplasm: Secondary | ICD-10-CM

## 2018-08-05 NOTE — Progress Notes (Signed)
[address]       [place] [id number]             [phone]      Mackey BirchwoodRandy L Hottel [facility] Medical Record #[id number] Date of Birth: 09/08/1991  Referring: No ref. provider found Primary Care: Patient, No Pcp Per  Chief Complaint:   POST OP FOLLOW UP 01/29/2016  OPERATIVE REPORT PREOPERATIVE DIAGNOSIS: Spontaneous left pneumothorax with continued air leak. POSTOPERATIVE DIAGNOSIS: Spontaneous left pneumothorax with continued air leak. SURGICAL PROCEDURE: Bronchoscopy, left video-assisted thoracoscopy, stapling of apical blebs, and mechanical pleurodesis. SURGEON: Sheliah PlaneEdward Blaize Epple, M.D.  07/21/2018  PREOPERATIVE DIAGNOSIS:  Right spontaneous pneumothorax with apical blebs.  POSTOPERATIVE DIAGNOSIS:  Right spontaneous pneumothorax with apical blebs.  SURGICAL PROCEDURES:  Bronchoscopy, right video-assisted thoracoscopy, stapling of apical bleb,  mechanical pleurodesis.  SURGEON:  Sheliah PlaneEdward Maridel Pixler, MD  History of Present Illness:     Patient returns after  Recent surgery for persistant airleak [facility]. [place] previous staple make-up blebs on the left in 2017.  since d/c not respiratory difficulity and has stopped smoking.     Past Medical History:  Diagnosis Date  . ADHD (attention deficit hyperactivity disorder)   . Pneumothorax 01/23/2016     Social History   Tobacco Use  Smoking Status Former Smoker  . Packs/day: 1.50  . Years: 17.00  . Pack years: 25.50  . Types: Cigarettes  . Last attempt to quit: 01/23/2016  . Years since quitting: 2.5  Smokeless Tobacco Current User  . Types: Chew    Social History   Substance and Sexual Activity  Alcohol Use Yes   Comment: occasional     Allergies  Allergen Reactions  . Cherry Nausea And Vomiting  . Wool Alcohol [Lanolin] Itching    Reaction to wool fabric  . Garlic Diarrhea  . Morphine And Related Other (See Comments)   Burning sensation on entire body.    Current Outpatient Medications  Medication Sig Dispense Refill  . acetaminophen (TYLENOL) 500 MG tablet Take 2 tablets (1,000 mg total) by mouth every 8 (eight) hours as needed. 30 tablet 0  . traMADol (ULTRAM) 50 MG tablet Take 1 tablet (50 mg total) by mouth every 6 (six) hours as needed (mild pain). 20 tablet 0   No current facility-administered medications for this visit.        Physical Exam: BP 135/83   Pulse 96   Resp 20   Ht 5\' 6"  (1.676 m)   Wt 165 lb (74.8 kg)   SpO2 98% Comment: RA  BMI 26.63 kg/m  General appearance: alert and cooperative Head: Normocephalic, without obvious abnormality, atraumatic Neck: no adenopathy, no carotid bruit, no JVD, supple, symmetrical, trachea midline and thyroid not enlarged, symmetric, no tenderness/mass/nodules Resp: clear to auscultation bilaterally Cardio: regular rate and rhythm, S1, S2 normal, no murmur, click, rub or gallop Extremities: extremities normal, atraumatic, no cyanosis or edema Neurologic: Grossly normal Sutures from chest tube sites were removed from the right chest  Diagnostic Studies & Laboratory data:     Recent Radiology Findings:   Dg Chest 2 View  Result Date: 08/05/2018 CLINICAL DATA:  27 year old male status post right side VATS last month for spontaneous pneumothorax due to lung blebs. EXAM: CHEST - 2 VIEW COMPARISON:  07/24/2018 and earlier. FINDINGS: Staple line in the medial aspect of both upper lobes. No pneumothorax, and lung volumes appear relatively normal. Mediastinal contours remain within  normal limits. Visualized tracheal air column is within normal limits. No pulmonary edema, pleural effusion or confluent pulmonary opacity. No acute osseous abnormality identified. Mild thoracolumbar scoliosis. Negative visible bowel gas pattern. IMPRESSION: Postoperative changes to both upper lobes with no acute cardiopulmonary abnormality. Electronically Signed   By: Odessa FlemingH  Hall  M.D.   On: 08/05/2018 15:22      Recent Lab Findings: Lab Results  Component Value Date   WBC 10.4 07/23/2018   HGB 13.5 07/23/2018   HCT 40.4 07/23/2018   PLT 197 07/23/2018   GLUCOSE 119 (H) 07/23/2018   ALT 13 07/23/2018   AST 16 07/23/2018   NA 140 07/23/2018   K 3.3 (L) 07/23/2018   CL 107 07/23/2018   CREATININE 0.81 07/23/2018   BUN 8 07/23/2018   CO2 26 07/23/2018   INR 0.97 07/20/2018      Assessment / Plan:      Stable post op course Return to work tomorrow with no lifting of 25 lbs for two more weeks Return as needed Congratulated on not smoking any more     Delight OvensEdward B Cali Hope MD      [address] [place] [id number] Office [phone]   Beeper [phone]  08/05/2018 4:07 PM

## 2020-07-19 IMAGING — DX DG CHEST 1V PORT
1 series · 1 of 1 positions shown · non-contrast
Comparison: 07/23/2018

CLINICAL DATA: Follow-up pneumothorax

EXAM:
PORTABLE CHEST 1 VIEW

[chest ap]
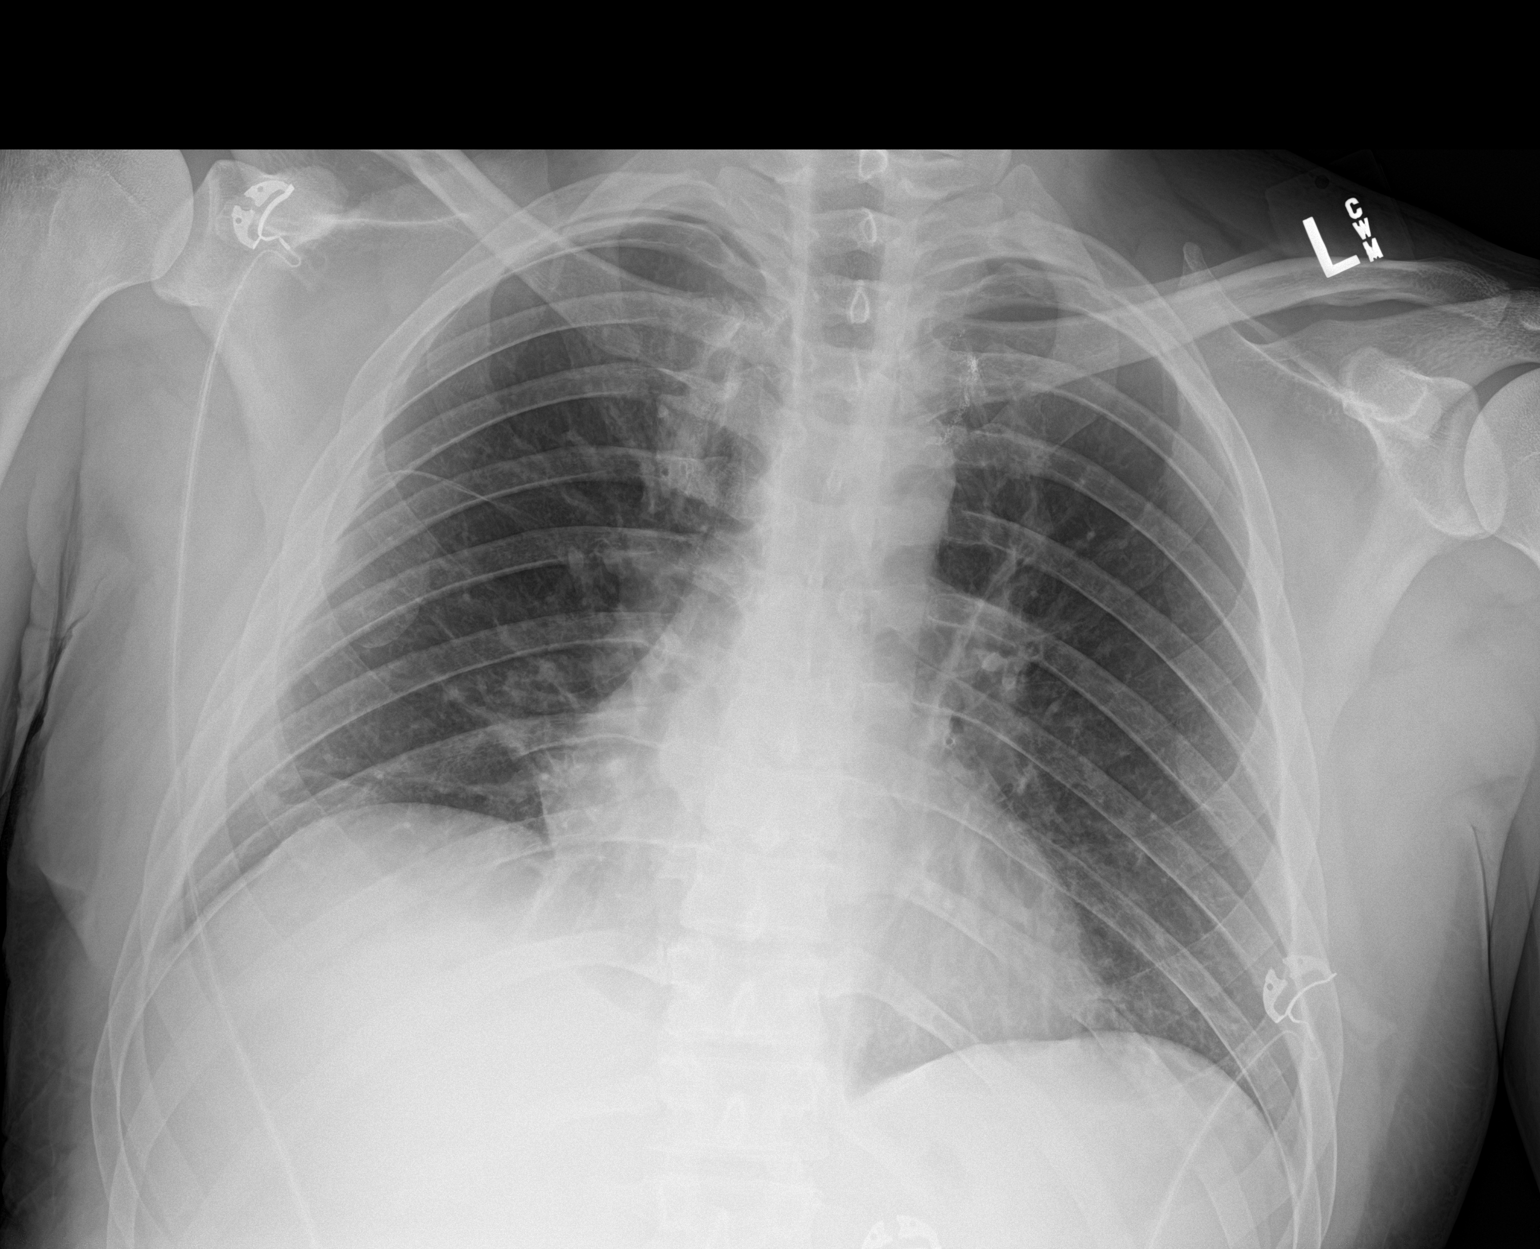

[1 of 1 positions shown; findings below may reference images not displayed]

FINDINGS: Cardiac shadows within normal limits. The lungs are well aerated
bilaterally. No definitive pneumothorax is noted following chest
tube removal. Minimal right basilar atelectasis is noted. No bony
abnormality is seen.
IMPRESSION: Minimal right basilar atelectasis.  No recurrent pneumothorax noted.

## 2020-07-31 IMAGING — DX DG CHEST 2V
2 series · 2 of 2 positions shown · non-contrast
Comparison: 07/24/2018 and earlier.

CLINICAL DATA: 27-year-old male status post right side VATS last
month for spontaneous pneumothorax due to lung blebs.

EXAM:
CHEST - 2 VIEW

[dg chest 2 view (1 of 2)]
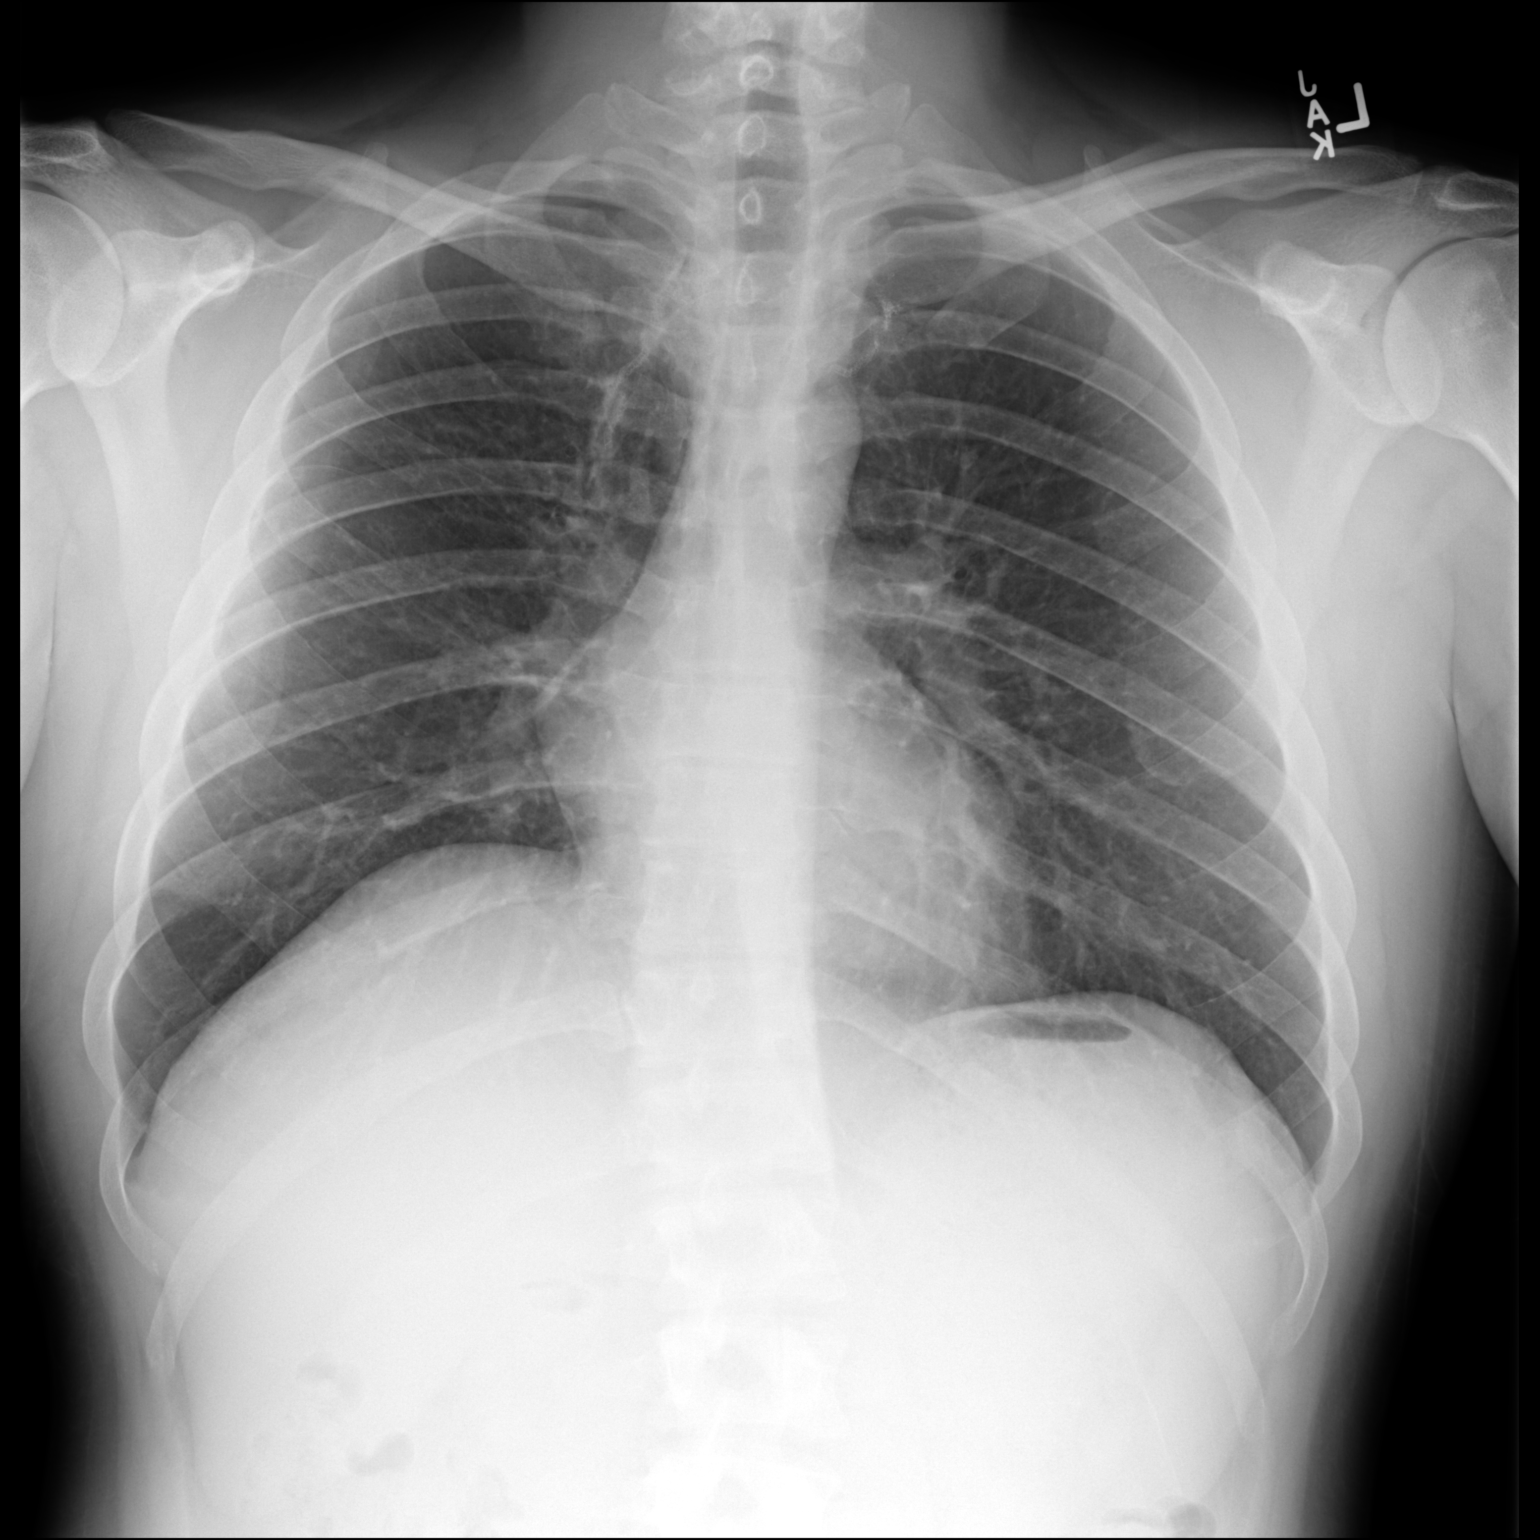

[dg chest 2 view (2 of 2)]
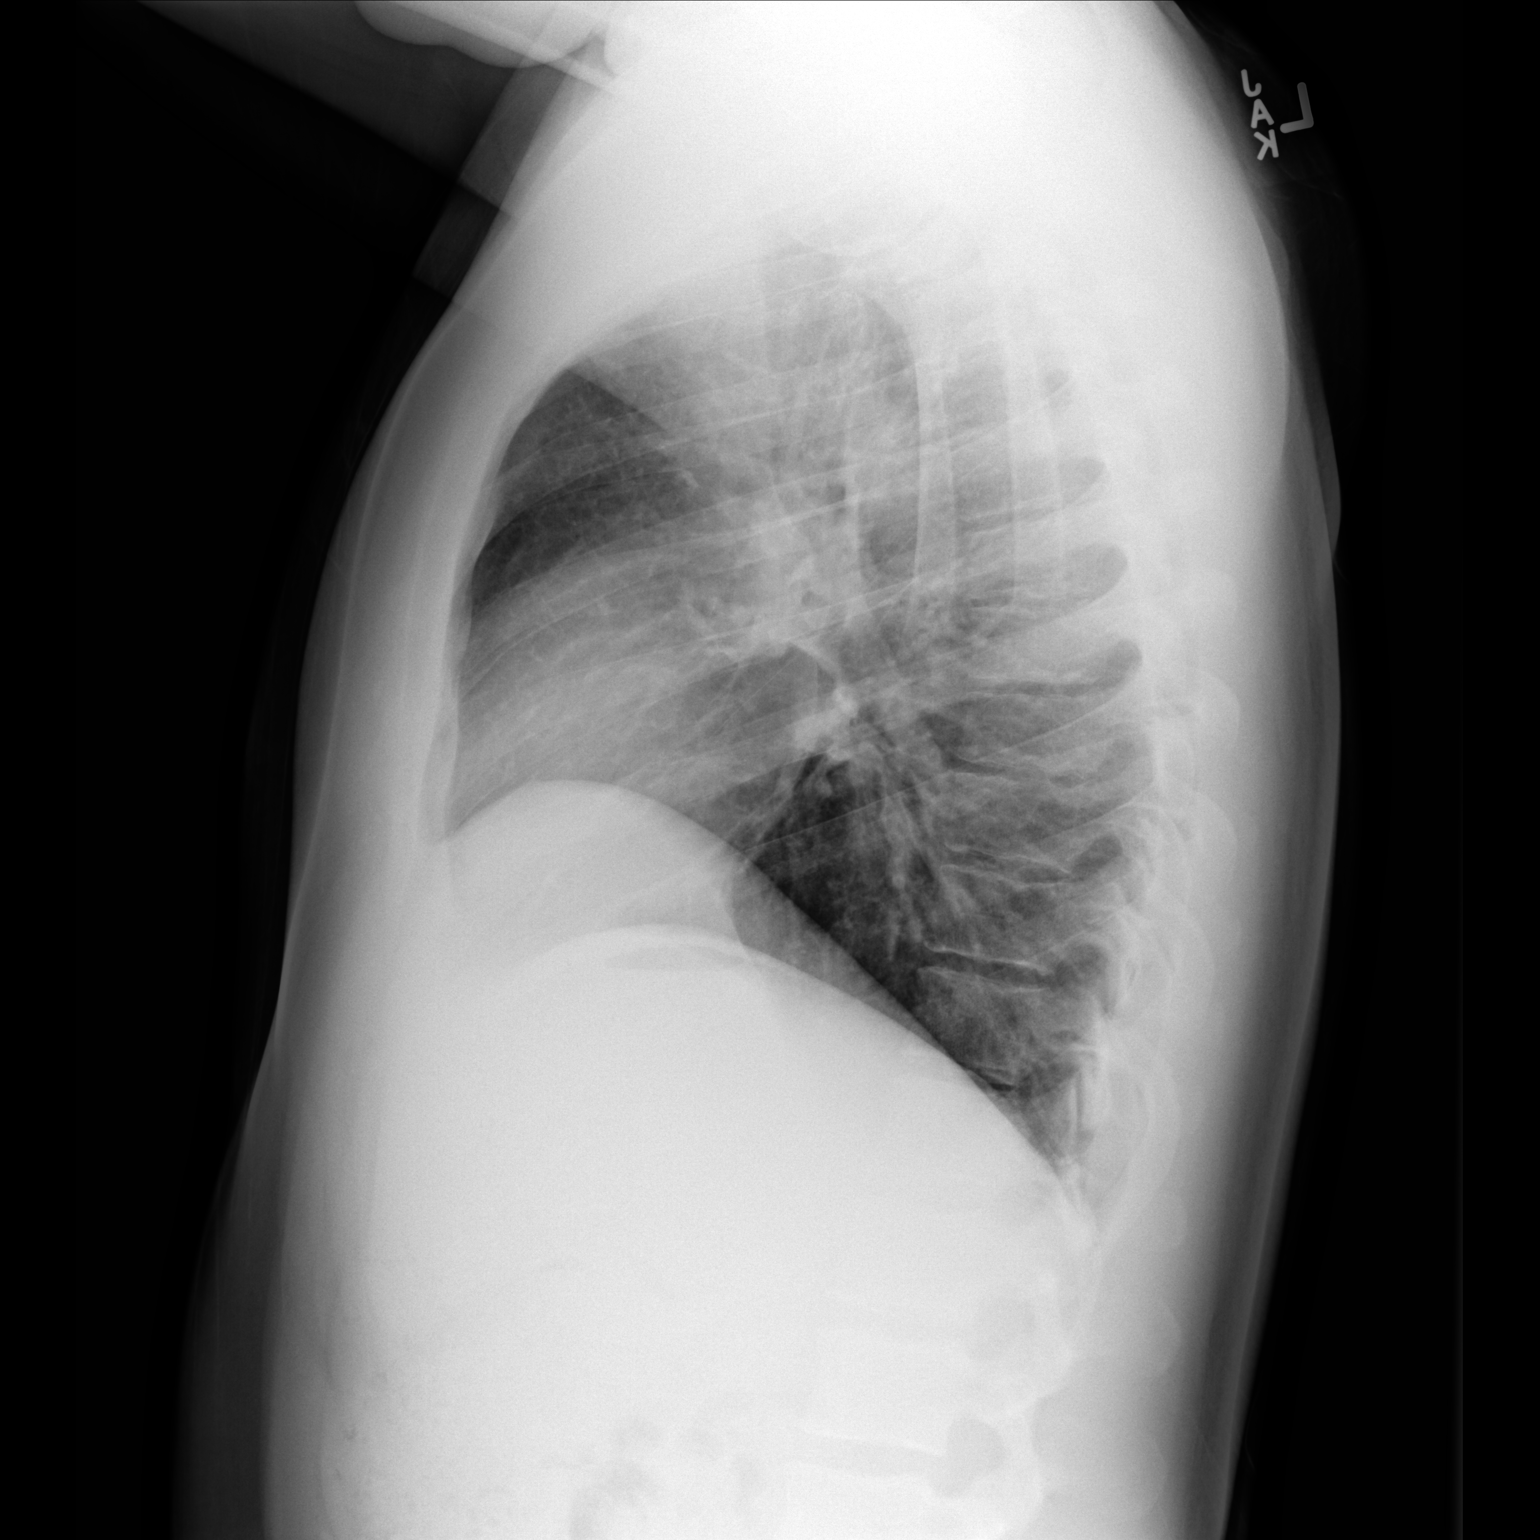

[2 of 2 positions shown; findings below may reference images not displayed]

FINDINGS: Staple line in the medial aspect of both upper lobes. No
pneumothorax, and lung volumes appear relatively normal. Mediastinal
contours remain within normal limits. Visualized tracheal air column
is within normal limits. No pulmonary edema, pleural effusion or
confluent pulmonary opacity. No acute osseous abnormality
identified. Mild thoracolumbar scoliosis. Negative visible bowel gas
pattern.
IMPRESSION: Postoperative changes to both upper lobes with no acute
cardiopulmonary abnormality.
